# Patient Record
Sex: Female | Born: 1960 | Race: White | Hispanic: No | Marital: Married | Smoking: Never smoker
Health system: Southern US, Community
[De-identification: ages and names within clinical notes are randomized; demographics above are authoritative.]

## PROBLEM LIST (undated history)

## (undated) DIAGNOSIS — F419 Anxiety disorder, unspecified: Secondary | ICD-10-CM

## (undated) DIAGNOSIS — J45909 Unspecified asthma, uncomplicated: Secondary | ICD-10-CM

## (undated) DIAGNOSIS — F32A Depression, unspecified: Secondary | ICD-10-CM

## (undated) DIAGNOSIS — M069 Rheumatoid arthritis, unspecified: Secondary | ICD-10-CM

## (undated) DIAGNOSIS — I499 Cardiac arrhythmia, unspecified: Secondary | ICD-10-CM

## (undated) DIAGNOSIS — I10 Essential (primary) hypertension: Secondary | ICD-10-CM

## (undated) HISTORY — PX: TOTAL HIP ARTHROPLASTY: SHX124

---

## 2019-11-25 ENCOUNTER — Other Ambulatory Visit: Payer: Self-pay

## 2019-11-25 ENCOUNTER — Emergency Department (HOSPITAL_BASED_OUTPATIENT_CLINIC_OR_DEPARTMENT_OTHER): Payer: BC Managed Care – PPO

## 2019-11-25 ENCOUNTER — Encounter (HOSPITAL_BASED_OUTPATIENT_CLINIC_OR_DEPARTMENT_OTHER): Payer: Self-pay | Admitting: *Deleted

## 2019-11-25 ENCOUNTER — Inpatient Hospital Stay (HOSPITAL_BASED_OUTPATIENT_CLINIC_OR_DEPARTMENT_OTHER)
Admission: EM | Admit: 2019-11-25 | Discharge: 2019-12-02 | DRG: 202 | Disposition: A | Discharge status: Home / Self Care | Payer: BC Managed Care – PPO | Attending: Family Medicine | Admitting: Family Medicine

## 2019-11-25 DIAGNOSIS — Z882 Allergy status to sulfonamides status: Secondary | ICD-10-CM | POA: Diagnosis not present

## 2019-11-25 DIAGNOSIS — Z96641 Presence of right artificial hip joint: Secondary | ICD-10-CM | POA: Diagnosis present

## 2019-11-25 DIAGNOSIS — J45901 Unspecified asthma with (acute) exacerbation: Secondary | ICD-10-CM | POA: Diagnosis present

## 2019-11-25 DIAGNOSIS — E872 Acidosis, unspecified: Secondary | ICD-10-CM

## 2019-11-25 DIAGNOSIS — F329 Major depressive disorder, single episode, unspecified: Secondary | ICD-10-CM | POA: Diagnosis present

## 2019-11-25 DIAGNOSIS — Z881 Allergy status to other antibiotic agents status: Secondary | ICD-10-CM | POA: Diagnosis not present

## 2019-11-25 DIAGNOSIS — F419 Anxiety disorder, unspecified: Secondary | ICD-10-CM | POA: Diagnosis present

## 2019-11-25 DIAGNOSIS — T451X5A Adverse effect of antineoplastic and immunosuppressive drugs, initial encounter: Secondary | ICD-10-CM | POA: Diagnosis present

## 2019-11-25 DIAGNOSIS — E876 Hypokalemia: Secondary | ICD-10-CM | POA: Diagnosis not present

## 2019-11-25 DIAGNOSIS — M069 Rheumatoid arthritis, unspecified: Secondary | ICD-10-CM | POA: Diagnosis present

## 2019-11-25 DIAGNOSIS — D721 Eosinophilia, unspecified: Secondary | ICD-10-CM | POA: Diagnosis present

## 2019-11-25 DIAGNOSIS — Z6835 Body mass index (BMI) 35.0-35.9, adult: Secondary | ICD-10-CM | POA: Diagnosis not present

## 2019-11-25 DIAGNOSIS — R911 Solitary pulmonary nodule: Secondary | ICD-10-CM | POA: Diagnosis present

## 2019-11-25 DIAGNOSIS — J302 Other seasonal allergic rhinitis: Secondary | ICD-10-CM | POA: Diagnosis present

## 2019-11-25 DIAGNOSIS — J189 Pneumonia, unspecified organism: Secondary | ICD-10-CM | POA: Diagnosis not present

## 2019-11-25 DIAGNOSIS — Z880 Allergy status to penicillin: Secondary | ICD-10-CM

## 2019-11-25 DIAGNOSIS — R0902 Hypoxemia: Secondary | ICD-10-CM

## 2019-11-25 DIAGNOSIS — J9601 Acute respiratory failure with hypoxia: Secondary | ICD-10-CM | POA: Diagnosis present

## 2019-11-25 DIAGNOSIS — Z79899 Other long term (current) drug therapy: Secondary | ICD-10-CM | POA: Diagnosis not present

## 2019-11-25 DIAGNOSIS — Z20822 Contact with and (suspected) exposure to covid-19: Secondary | ICD-10-CM | POA: Diagnosis present

## 2019-11-25 DIAGNOSIS — I10 Essential (primary) hypertension: Secondary | ICD-10-CM | POA: Diagnosis present

## 2019-11-25 HISTORY — DX: Anxiety disorder, unspecified: F41.9

## 2019-11-25 HISTORY — DX: Cardiac arrhythmia, unspecified: I49.9

## 2019-11-25 HISTORY — DX: Essential (primary) hypertension: I10

## 2019-11-25 HISTORY — DX: Depression, unspecified: F32.A

## 2019-11-25 HISTORY — DX: Rheumatoid arthritis, unspecified: M06.9

## 2019-11-25 HISTORY — DX: Unspecified asthma, uncomplicated: J45.909

## 2019-11-25 LAB — CBC WITH DIFFERENTIAL/PLATELET
Abs Immature Granulocytes: 0.03 10*3/uL (ref 0.00–0.07)
Basophils Absolute: 0 10*3/uL (ref 0.0–0.1)
Basophils Relative: 0 %
Eosinophils Absolute: 0 10*3/uL (ref 0.0–0.5)
Eosinophils Relative: 0 %
HCT: 36.8 % (ref 36.0–46.0)
Hemoglobin: 12.4 g/dL (ref 12.0–15.0)
Immature Granulocytes: 1 %
Lymphocytes Relative: 9 %
Lymphs Abs: 0.5 10*3/uL — ABNORMAL LOW (ref 0.7–4.0)
MCH: 35.2 pg — ABNORMAL HIGH (ref 26.0–34.0)
MCHC: 33.7 g/dL (ref 30.0–36.0)
MCV: 104.5 fL — ABNORMAL HIGH (ref 80.0–100.0)
Monocytes Absolute: 0.6 10*3/uL (ref 0.1–1.0)
Monocytes Relative: 11 %
Neutro Abs: 4.6 10*3/uL (ref 1.7–7.7)
Neutrophils Relative %: 79 %
Platelets: 248 10*3/uL (ref 150–400)
RBC: 3.52 MIL/uL — ABNORMAL LOW (ref 3.87–5.11)
RDW: 13.1 % (ref 11.5–15.5)
WBC: 5.8 10*3/uL (ref 4.0–10.5)
nRBC: 0 % (ref 0.0–0.2)

## 2019-11-25 LAB — BASIC METABOLIC PANEL
Anion gap: 12 (ref 5–15)
BUN: 20 mg/dL (ref 6–20)
CO2: 19 mmol/L — ABNORMAL LOW (ref 22–32)
Calcium: 8.8 mg/dL — ABNORMAL LOW (ref 8.9–10.3)
Chloride: 105 mmol/L (ref 98–111)
Creatinine, Ser: 0.73 mg/dL (ref 0.44–1.00)
GFR calc Af Amer: >60 mL/min (ref >60)
GFR calc non Af Amer: >60 mL/min (ref >60)
Glucose, Bld: 227 mg/dL — ABNORMAL HIGH (ref 70–99)
Potassium: 3.5 mmol/L (ref 3.5–5.1)
Sodium: 136 mmol/L (ref 135–145)

## 2019-11-25 LAB — SARS CORONAVIRUS 2 BY RT PCR (HOSPITAL ORDER, PERFORMED IN ~~LOC~~ HOSPITAL LAB): SARS Coronavirus 2: NEGATIVE

## 2019-11-25 MED ORDER — PREDNISONE 20 MG PO TABS
40.0000 mg | ORAL_TABLET | Freq: Every day | ORAL | Status: DC
  Administered 2019-11-26 – 2019-11-30 (×5): 40 mg via ORAL
  Filled 2019-11-25 (×5): qty 2

## 2019-11-25 MED ORDER — SODIUM CHLORIDE 0.9 % IV SOLN
500.0000 mg | Freq: Once | INTRAVENOUS | Status: AC
  Administered 2019-11-25: 500 mg via INTRAVENOUS
  Filled 2019-11-25: qty 500

## 2019-11-25 MED ORDER — ALBUTEROL (5 MG/ML) CONTINUOUS INHALATION SOLN
10.0000 mg/h | INHALATION_SOLUTION | Freq: Once | RESPIRATORY_TRACT | Status: AC
  Administered 2019-11-25: 10 mg/h via RESPIRATORY_TRACT
  Filled 2019-11-25: qty 20

## 2019-11-25 MED ORDER — BUDESONIDE 0.25 MG/2ML IN SUSP
0.2500 mg | Freq: Two times a day (BID) | RESPIRATORY_TRACT | Status: DC
  Administered 2019-11-26 – 2019-12-02 (×12): 0.25 mg via RESPIRATORY_TRACT
  Filled 2019-11-25 (×12): qty 2

## 2019-11-25 MED ORDER — SODIUM CHLORIDE 0.9 % IV SOLN: INTRAVENOUS | Status: AC

## 2019-11-25 MED ORDER — SODIUM CHLORIDE 0.9 % IV SOLN
500.0000 mg | INTRAVENOUS | Status: DC
  Administered 2019-11-26 – 2019-11-28 (×2): 500 mg via INTRAVENOUS
  Filled 2019-11-25 (×2): qty 500

## 2019-11-25 MED ORDER — IPRATROPIUM-ALBUTEROL 0.5-2.5 (3) MG/3ML IN SOLN
3.0000 mL | Freq: Four times a day (QID) | RESPIRATORY_TRACT | Status: DC
  Administered 2019-11-26 (×2): 3 mL via RESPIRATORY_TRACT
  Filled 2019-11-25 (×2): qty 3

## 2019-11-25 MED ORDER — ALBUTEROL (5 MG/ML) CONTINUOUS INHALATION SOLN: 5.0000 mg/h | INHALATION_SOLUTION | Freq: Once | RESPIRATORY_TRACT | Status: DC

## 2019-11-25 MED ORDER — ALBUTEROL SULFATE HFA 108 (90 BASE) MCG/ACT IN AERS
4.0000 | INHALATION_SPRAY | Freq: Once | RESPIRATORY_TRACT | Status: AC
  Administered 2019-11-25: 4 via RESPIRATORY_TRACT
  Filled 2019-11-25: qty 6.7

## 2019-11-25 MED ORDER — ALBUTEROL SULFATE (2.5 MG/3ML) 0.083% IN NEBU: 2.5000 mg | INHALATION_SOLUTION | RESPIRATORY_TRACT | Status: DC | PRN

## 2019-11-25 MED ORDER — ENOXAPARIN SODIUM 40 MG/0.4ML ~~LOC~~ SOLN
40.0000 mg | SUBCUTANEOUS | Status: DC
  Administered 2019-11-26 – 2019-12-01 (×7): 40 mg via SUBCUTANEOUS
  Filled 2019-11-25 (×7): qty 0.4

## 2019-11-25 MED ORDER — ALBUTEROL SULFATE HFA 108 (90 BASE) MCG/ACT IN AERS
INHALATION_SPRAY | RESPIRATORY_TRACT | Status: AC
  Filled 2019-11-25: qty 6.7

## 2019-11-25 MED ORDER — SODIUM CHLORIDE 0.9 % IV SOLN
1.0000 g | INTRAVENOUS | Status: DC
  Administered 2019-11-26 – 2019-11-28 (×3): 1 g via INTRAVENOUS
  Filled 2019-11-25 (×3): qty 1

## 2019-11-25 MED ORDER — SODIUM CHLORIDE 0.9 % IV SOLN
1.0000 g | Freq: Once | INTRAVENOUS | Status: AC
  Administered 2019-11-25: 1 g via INTRAVENOUS
  Filled 2019-11-25: qty 10

## 2019-11-25 MED ORDER — IOHEXOL 350 MG/ML SOLN
100.0000 mL | Freq: Once | INTRAVENOUS | Status: AC | PRN
  Administered 2019-11-25: 73 mL via INTRAVENOUS

## 2019-11-25 MED ORDER — METHYLPREDNISOLONE SODIUM SUCC 125 MG IJ SOLR
125.0000 mg | Freq: Once | INTRAMUSCULAR | Status: AC
  Administered 2019-11-25: 125 mg via INTRAVENOUS
  Filled 2019-11-25: qty 2

## 2019-11-25 MED ORDER — SODIUM CHLORIDE 0.9 % IV SOLN
INTRAVENOUS | Status: DC | PRN
  Administered 2019-11-25 – 2019-11-26 (×2): 1000 mL via INTRAVENOUS

## 2019-11-25 NOTE — ED Notes (Signed)
ED Provider at bedside. 

## 2019-11-25 NOTE — ED Notes (Signed)
Arrived with IV in left hand, dsg wet and catheter noted to be partially out of hand, immediately dc'd and dsg applied

## 2019-11-25 NOTE — ED Triage Notes (Addendum)
Pt so hx of asthma with shob. O2 sats 82% on arrival. Pt endorses neb treatments pta

## 2019-11-25 NOTE — H&P (Signed)
History and Physical    Bridgitte Felicetti PHX:[id number] DOB: 02/07/1961 DOA: 11/25/2019  PCP: Jettie Booze, NP Patient coming from: [facility]  Chief Complaint: Shortness of breath  HPI: Dana Reyes is a 59 y.o. female with medical history significant of asthma, hypertension, rheumatoid arthritis presented to the ED with complaints of shortness of breath.  Patient reports recently being treated with a course of a steroid for an asthma exacerbation.  After she finished the course of steroid, she started having shortness of breath again and has been coughing a lot.  Denies fevers.  States she has been using her home asthma medications including Advair, Singulair, and albuterol inhaler but they are not helping.  She has no other complaints.  Denies nausea, vomiting, abdominal pain, or diarrhea.  ED Course: Oxygen saturation 84% on room air on arrival, wheezing, and was in respiratory distress.  Tachycardic with heart rate in the 120s.  She was placed on 4 L supplemental oxygen with improvement of oxygen saturation to 95%.  Tachycardia improved.  Patient was given IV Solu-Medrol 125 mg, albuterol inhaler and continuous neb treatment.  WBC 5.8, hemoglobin 12.4, hematocrit 36.8, platelet 248k.  Sodium 136, potassium 3.5, chloride 105, bicarb 19, BUN 20, creatinine 0.7, glucose 227, anion gap 12.  SARS-CoV-2 PCR test negative.  Chest x-ray showing hazy peribronchial airspace opacities in the left lower thorax suspicious for bronchitis or early atypical/viral pneumonia.  CT angiogram chest negative for PE.  Showing patchy and confluent interstitial opacities in both lungs, most pronounced in the periphery of the lungs.  Findings suspicious for COVID-19 pneumonitis versus hypersensitivity pneumonitis versus influenza pneumonia and organizing pneumonia versus drug toxicity versus connective tissue disease.  She was given ceftriaxone and azithromycin.   Review of Systems:  All systems reviewed and apart from  history of presenting illness, are negative.  Past Medical History:  Diagnosis Date  . Anxiety   . Asthma   . Cardiac dysrhythmia   . Depression   . Hypertension   . RA (rheumatoid arthritis) ([place])     Past Surgical History:  Procedure Laterality Date  . TOTAL HIP ARTHROPLASTY Right      reports that she has never smoked. She has never used smokeless tobacco. She reports current alcohol use. She reports that she does not use drugs.  Allergies  Allergen Reactions  . Penicillins   . Sulphamethoxydiazine     Family History  Problem Relation Age of Onset  . Breast cancer Mother   . CAD Father     Prior to Admission medications   Not on File    Physical Exam: Vitals:   11/25/19 1930 11/25/19 2000 11/25/19 2013 11/25/19 2059  BP: 124/77 123/75  140/73  Pulse: (!) 110 (!) 110  (!) 106  Resp: (!) '24 19  20  ' Temp:   98.4 F (36.9 C) 98.1 F (36.7 C)  TempSrc:   Oral Oral  SpO2: 96% 93%  96%  Weight:      Height:        Physical Exam Constitutional:      General: She is not in acute distress. HENT:     Head: Normocephalic and atraumatic.  Eyes:     Extraocular Movements: Extraocular movements intact.     Conjunctiva/sclera: Conjunctivae normal.  Cardiovascular:     Rate and Rhythm: Normal rate and regular rhythm.     Pulses: Normal pulses.  Pulmonary:     Effort: Pulmonary effort is normal.     Breath sounds:  Rales present. No wheezing.     Comments: Satting well on 4 L supplemental oxygen No increased work of breathing Abdominal:     General: Bowel sounds are normal. There is no distension.     Palpations: Abdomen is soft.     Tenderness: There is no abdominal tenderness.  Musculoskeletal:        General: No swelling or tenderness.     Cervical back: Normal range of motion and neck supple.  Skin:    General: Skin is warm and dry.  Neurological:     Mental Status: She is alert and oriented to person, place, and time.     Labs on Admission: I have  personally reviewed following labs and imaging studies  CBC: Recent Labs  Lab 11/25/19 1414  WBC 5.8  NEUTROABS 4.6  HGB 12.4  HCT 36.8  MCV 104.5*  PLT 357   Basic Metabolic Panel: Recent Labs  Lab 11/25/19 1414  NA 136  K 3.5  CL 105  CO2 19*  GLUCOSE 227*  BUN 20  CREATININE 0.73  CALCIUM 8.8*   GFR: Estimated Creatinine Clearance: 80.9 mL/min (by C-G formula based on SCr of 0.73 mg/dL). Liver Function Tests: No results for input(s): AST, ALT, ALKPHOS, BILITOT, PROT, ALBUMIN in the last 168 hours. No results for input(s): LIPASE, AMYLASE in the last 168 hours. No results for input(s): AMMONIA in the last 168 hours. Coagulation Profile: No results for input(s): INR, PROTIME in the last 168 hours. Cardiac Enzymes: No results for input(s): CKTOTAL, CKMB, CKMBINDEX, TROPONINI in the last 168 hours. BNP (last 3 results) No results for input(s): PROBNP in the last 8760 hours. HbA1C: No results for input(s): HGBA1C in the last 72 hours. CBG: No results for input(s): GLUCAP in the last 168 hours. Lipid Profile: No results for input(s): CHOL, HDL, LDLCALC, TRIG, CHOLHDL, LDLDIRECT in the last 72 hours. Thyroid Function Tests: No results for input(s): TSH, T4TOTAL, FREET4, T3FREE, THYROIDAB in the last 72 hours. Anemia Panel: No results for input(s): VITAMINB12, FOLATE, FERRITIN, TIBC, IRON, RETICCTPCT in the last 72 hours. Urine analysis: No results found for: COLORURINE, APPEARANCEUR, LABSPEC, [place], GLUCOSEU, HGBUR, BILIRUBINUR, KETONESUR, PROTEINUR, UROBILINOGEN, NITRITE, LEUKOCYTESUR  Radiological Exams on Admission: CT Angio Chest PE W/Cm &/Or Wo Cm  Result Date: 11/25/2019 CLINICAL DATA:  Dyspnea Cough and chest congestion. Negative COVID-19 test yesterday. EXAM: CT ANGIOGRAPHY CHEST WITH CONTRAST TECHNIQUE: Multidetector CT imaging of the chest was performed using the standard protocol during bolus administration of intravenous contrast. Multiplanar CT image  reconstructions and MIPs were obtained to evaluate the vascular anatomy. CONTRAST:  29m OMNIPAQUE IOHEXOL 350 MG/ML SOLN COMPARISON:  Portable chest obtained earlier today. FINDINGS: Cardiovascular: Satisfactory opacification of the pulmonary arteries to the segmental level. No evidence of pulmonary embolism. Normal heart size. No pericardial effusion. Mediastinum/Nodes: Mildly enlarged right hilar lymph node with a short axis diameter 12 mm on image number 47 series 6. Normal sized subcarinal and AP window lymph nodes. No enlarged axillary or left hilar lymph nodes. Lungs/Pleura: Patchy and confluent interstitial opacities in both lungs, most pronounced in the periphery of the lungs. 3 mm subpleural nodule in superior segment of the left lower lobe on image number 45 series 7. Minimal bilateral dependent atelectasis.  No pleural fluid. Upper Abdomen: Diffuse low density of the liver relative to the spleen. Musculoskeletal: Mild thoracic spine degenerative changes. Interbody and hardware fusion in the lower cervical spine. Review of the MIP images confirms the above findings. IMPRESSION: 1. No  pulmonary emboli. 2. Patchy and confluent interstitial opacities in both lungs, most pronounced in the periphery of the lungs. This can be seen with COVID-19 pneumonitis and hypersensitivity pneumonitis. Other processes such as influenza pneumonia and organizing pneumonia, as can be seen with drug toxicity and connective tissue disease, can cause a similar imaging pattern. 3. 3 mm subpleural nodule in the superior segment of the left lower lobe. No follow-up needed if patient is low-risk. Non-contrast chest CT can be considered in 12 months if patient is high-risk. This recommendation follows the consensus statement: Guidelines for Management of Incidental Pulmonary Nodules Detected on CT Images: From the Fleischner Society 2017; Radiology 2017; 284:228-243. 4. Mildly enlarged right hilar lymph node, most likely reactive. 5.  Diffuse hepatic steatosis. Electronically Signed   By: Claudie Revering M.D.   On: 11/25/2019 16:07   DG Chest Portable 1 View  Result Date: 11/25/2019 CLINICAL DATA:  Shortness of breath. EXAM: PORTABLE CHEST 1 VIEW COMPARISON:  None. FINDINGS: Cardiomediastinal silhouette is normal. Mediastinal contours appear intact. Hazy peribronchial airspace opacities in the left lower thorax. Osseous structures are without acute abnormality. Soft tissues are grossly normal. IMPRESSION: Hazy peribronchial airspace opacities in the left lower thorax may represent bronchitis or early atypical/viral pneumonia. Electronically Signed   By: Fidela Salisbury M.D.   On: 11/25/2019 14:42    EKG: Independently reviewed.  Sinus tachycardia, no prior tracing for comparison.  Assessment/Plan Principal Problem:   Asthma exacerbation Active Problems:   Pneumonia   Metabolic acidosis   Hypertension   Rheumatoid arthritis ([place])   Acute hypoxemic respiratory failure secondary to acute asthma exacerbation and suspected pneumonia: Oxygen saturation 84% on room air, wheezing, and was in respiratory distress at the time of ED arrival.  She was treated with IV Solu-Medrol 125 mg, albuterol inhaler and continuous neb treatment.  Not wheezing or tachypneic at present.  Satting well on 2 L supplemental oxygen. CT angiogram chest negative for PE.  Showing patchy and confluent interstitial opacities in both lungs, most pronounced in the periphery of the lungs.  Findings suspicious for COVID-19 pneumonitis versus hypersensitivity pneumonitis versus influenza pneumonia and organizing pneumonia versus drug toxicity versus connective tissue disease.  Afebrile and no leukocytosis on labs.  Covid infection less likely as SARS-CoV-2 PCR test negative and she has already been vaccinated. -Prednisone 40 mg daily starting in the morning.  DuoNebs every 6 hours, Pulmicort nebulizer twice daily, and albuterol nebulizer as needed.  Continue  ceftriaxone and azithromycin.  Check procalcitonin level.  Check ESR and CRP.  Order RVP.  Order sputum and blood cultures.  Strep pneumo and Legionella urinary antigen. Continuous pulse ox, supplemental oxygen as needed.  Normal anion gap metabolic acidosis: Bicarb 19, anion gap 12.  Not on a diuretic and not endorsing diarrhea. -IV fluid hydration and repeat BMP in a.m.  Pulmonary nodule: CT showing a 3 mm subpleural nodule in the superior segment of the left lower lobe. -Ensure PCP follow-up for noncontrast chest CT within 12 months  Hypertension: Currently normotensive. -Pharmacy med rec pending.  Rheumatoid arthritis -Resume home meds after pharmacy med rec is done  DVT prophylaxis: Lovenox Code Status: Full code Family Communication: No family available at this time. Disposition Plan: Status is: Inpatient  Remains inpatient appropriate because:Ongoing diagnostic testing needed not appropriate for outpatient work up, IV treatments appropriate due to intensity of illness or inability to take PO and Inpatient level of care appropriate due to severity of illness   Dispo: The patient is  from: Home              Anticipated d/c is to: Home              Anticipated d/c date is: 2 days              Patient currently is not medically stable to d/c.  The medical decision making on this patient was of high complexity and the patient is at high risk for clinical deterioration, therefore this is a level 3 visit.  Shela Leff MD [facility]  If 7PM-7AM, please contact night-coverage [email or web address]  11/25/2019, 11:06 PM

## 2019-11-25 NOTE — ED Notes (Signed)
CAT finished and RT went to attempt ambulation. SAT dropped to 89% before we could even attempt out walk. Placed back on [facility]. MD made aware.

## 2019-11-25 NOTE — ED Notes (Signed)
MDI 4 puffs of Albuterol inhaler administered, SMI utilized, pt very cooperative and good inspiration noted.

## 2019-11-25 NOTE — ED Provider Notes (Signed)
[facility] EMERGENCY DEPARTMENT Provider Note   CSN: [id number] Arrival date & time: 11/25/19  1328     History Chief Complaint  Patient presents with  . Shortness of Breath    Dana Reyes is a 59 y.o. female past medical history significant for asthma, rheumatoid arthritis on methotrexate and embrel.  Received COVID vaccinations.  HPI Patient presents to emergency department today with chief complaint of progressively worsening shortness of breath x2 days.  Patient tried steroids, nebulizer treatment and IV magnesium last night without symptom improvement. She was found to by hypoxic at rest with oxygen saturation of 87% on room air, it was worse with ambulation.  Also endorsing nonproductive cough.  Patient finished a prednisone Dosepak x1 week ago for an asthma exacerbation. She denies any recent illness, fever, chills, hemoptysis, chest pain, low extremity edema, abdominal pain, nausea, emesis, urinary symptoms, diarrhea, rash.  Patient has been hospitalized with asthma exacerbation 1 time in the past, never intubated secondary to exacerbations.  No sick contacts or known Covid exposures.  No tobacco use.   Past Medical History:  Diagnosis Date  . Anxiety   . Asthma   . Cardiac dysrhythmia   . Depression   . Hypertension   . RA (rheumatoid arthritis) [facility])     Patient Active Problem List   Diagnosis Date Noted  . Asthma exacerbation 11/25/2019       OB History   No obstetric history on file.     No family history on file.  Social History   Tobacco Use  . Smoking status: Not on file  Substance Use Topics  . Alcohol use: Not on file  . Drug use: Not on file    Home Medications Prior to Admission medications   Not on File    Allergies    Penicillins and Sulphamethoxydiazine  Review of Systems   Review of Systems All other systems are reviewed and are negative for acute change except as noted in the HPI.  Physical Exam Updated Vital  Signs BP 128/72 (BP Location: Right Arm)   Pulse (!) 104   Temp 97.8 F (36.6 C) (Oral)   Resp 18   Ht  (1.6 m)   Wt 90.7 kg   SpO2 99%   BMI 35.43 kg/m   Physical Exam Vitals and nursing note reviewed.  Constitutional:      General: She is not in acute distress.    Appearance: She is not ill-appearing.  HENT:     Head: Normocephalic and atraumatic.     Right Ear: Tympanic membrane and external ear normal.     Left Ear: Tympanic membrane and external ear normal.     Nose: Nose normal.     Mouth/Throat:     Mouth: Mucous membranes are moist.     Pharynx: Oropharynx is clear.  Eyes:     General: No scleral icterus.       Right eye: No discharge.        Left eye: No discharge.     Extraocular Movements: Extraocular movements intact.     Conjunctiva/sclera: Conjunctivae normal.     Pupils: Pupils are equal, round, and reactive to light.  Neck:     Vascular: No JVD.  Cardiovascular:     Rate and Rhythm: Normal rate and regular rhythm.     Pulses: Normal pulses.          Radial pulses are 2+ on the right side and 2+ on the left side.  Heart sounds: Normal heart sounds.  Pulmonary:     Comments: Expiratory wheeze heard. Symmetric chest rise.  Oxygen saturation on room air is 84%.  Patient in respiratory distress.  Only able to give 1 word answers. Accessory muscle use. Abdominal:     Comments: Abdomen is soft, non-distended, and non-tender in all quadrants. No rigidity, no guarding. No peritoneal signs.  Musculoskeletal:        General: Normal range of motion.     Cervical back: Normal range of motion.  Skin:    General: Skin is warm and dry.     Capillary Refill: Capillary refill takes less than 2 seconds.  Neurological:     Mental Status: She is oriented to person, place, and time.     GCS: GCS eye subscore is 4. GCS verbal subscore is 5. GCS motor subscore is 6.     Comments: Fluent speech, no facial droop.  Psychiatric:        Behavior: Behavior normal.      ED Results / Procedures / Treatments   Labs (all labs ordered are listed, but only abnormal results are displayed) Labs Reviewed  CBC WITH DIFFERENTIAL/PLATELET - Abnormal; Notable for the following components:      Result Value   RBC 3.52 (*)    MCV 104.5 (*)    MCH 35.2 (*)    Lymphs Abs 0.5 (*)    All other components within normal limits  BASIC METABOLIC PANEL - Abnormal; Notable for the following components:   CO2 19 (*)    Glucose, Bld 227 (*)    Calcium 8.8 (*)    All other components within normal limits  SARS CORONAVIRUS 2 BY RT PCR [facility] ORDER, PERFORMED IN [facility])    EKG EKG Interpretation  Date/Time:  Sunday November 25 2019 14:44:54 EDT Ventricular Rate:  105 PR Interval:    QRS Duration: 86 QT Interval:  364 QTC Calculation: 482 R Axis:   90 Text Interpretation: Sinus tachycardia Borderline right axis deviation Low voltage, precordial leads Borderline T abnormalities, diffuse leads No previous ECGs available Confirmed by Vanetta Mulders [id number]) on 11/25/2019 4:46:11 PM   Radiology CT Angio Chest PE W/Cm &/Or Wo Cm  Result Date: 11/25/2019 CLINICAL DATA:  Dyspnea Cough and chest congestion. Negative COVID-19 test yesterday. EXAM: CT ANGIOGRAPHY CHEST WITH CONTRAST TECHNIQUE: Multidetector CT imaging of the chest was performed using the standard protocol during bolus administration of intravenous contrast. Multiplanar CT image reconstructions and MIPs were obtained to evaluate the vascular anatomy. CONTRAST:  73mL OMNIPAQUE IOHEXOL 350 MG/ML SOLN COMPARISON:  Portable chest obtained earlier today. FINDINGS: Cardiovascular: Satisfactory opacification of the pulmonary arteries to the segmental level. No evidence of pulmonary embolism. Normal heart size. No pericardial effusion. Mediastinum/Nodes: Mildly enlarged right hilar lymph node with a short axis diameter 12 mm on image number 47 series 6. Normal sized subcarinal and AP window lymph  nodes. No enlarged axillary or left hilar lymph nodes. Lungs/Pleura: Patchy and confluent interstitial opacities in both lungs, most pronounced in the periphery of the lungs. 3 mm subpleural nodule in superior segment of the left lower lobe on image number 45 series 7. Minimal bilateral dependent atelectasis.  No pleural fluid. Upper Abdomen: Diffuse low density of the liver relative to the spleen. Musculoskeletal: Mild thoracic spine degenerative changes. Interbody and hardware fusion in the lower cervical spine. Review of the MIP images confirms the above findings. IMPRESSION: 1. No pulmonary emboli. 2. Patchy and confluent interstitial opacities  in both lungs, most pronounced in the periphery of the lungs. This can be seen with COVID-19 pneumonitis and hypersensitivity pneumonitis. Other processes such as influenza pneumonia and organizing pneumonia, as can be seen with drug toxicity and connective tissue disease, can cause a similar imaging pattern. 3. 3 mm subpleural nodule in the superior segment of the left lower lobe. No follow-up needed if patient is low-risk. Non-contrast chest CT can be considered in 12 months if patient is high-risk. This recommendation follows the consensus statement: Guidelines for Management of Incidental Pulmonary Nodules Detected on CT Images: From the Fleischner Society 2017; Radiology 2017; 284:228-243. 4. Mildly enlarged right hilar lymph node, most likely reactive. 5. Diffuse hepatic steatosis. Electronically Signed   By: Beckie Salts M.D.   On: 11/25/2019 16:07   DG Chest Portable 1 View  Result Date: 11/25/2019 CLINICAL DATA:  Shortness of breath. EXAM: PORTABLE CHEST 1 VIEW COMPARISON:  None. FINDINGS: Cardiomediastinal silhouette is normal. Mediastinal contours appear intact. Hazy peribronchial airspace opacities in the left lower thorax. Osseous structures are without acute abnormality. Soft tissues are grossly normal. IMPRESSION: Hazy peribronchial airspace opacities in  the left lower thorax may represent bronchitis or early atypical/viral pneumonia. Electronically Signed   By: Ted Mcalpine M.D.   On: 11/25/2019 14:42    Procedures .Critical Care Performed by: Sherene Sires, PA-C Authorized by: Sherene Sires, PA-C   Critical care provider statement:    Critical care time (minutes):  30   Critical care time was exclusive of:  Separately billable procedures and treating other patients and teaching time   Critical care was necessary to treat or prevent imminent or life-threatening deterioration of the following conditions:  Respiratory failure   Critical care was time spent personally by me on the following activities:  Development of treatment plan with patient or surrogate, evaluation of patient's response to treatment, examination of patient, obtaining history from patient or surrogate, ordering and performing treatments and interventions, ordering and review of laboratory studies, ordering and review of radiographic studies, pulse oximetry, re-evaluation of patient's condition and review of old charts   I assumed direction of critical care for this patient from another provider in my specialty: no     (including critical care time)  Medications Ordered in ED Medications  azithromycin (ZITHROMAX) 500 mg in sodium chloride 0.9 % 250 mL IVPB (has no administration in time range)  0.9 %  sodium chloride infusion (1,000 mLs Intravenous New Bag/Given 11/25/19 1718)  methylPREDNISolone sodium succinate (SOLU-MEDROL) 125 mg/2 mL injection 125 mg (125 mg Intravenous Given 11/25/19 1422)  albuterol (VENTOLIN HFA) 108 (90 Base) MCG/ACT inhaler 4-6 puff (4 puffs Inhalation Given 11/25/19 1425)  iohexol (OMNIPAQUE) 350 MG/ML injection 100 mL (73 mLs Intravenous Contrast Given 11/25/19 1542)  albuterol (PROVENTIL,VENTOLIN) solution continuous neb (10 mg/hr Nebulization Given 11/25/19 1559)  cefTRIAXone (ROCEPHIN) 1 g in sodium chloride 0.9 % 100 mL IVPB (1 g  Intravenous New Bag/Given 11/25/19 1722)    ED Course  I have reviewed the triage vital signs and the nursing notes.  Pertinent labs & imaging results that were available during my care of the patient were reviewed by me and considered in my medical decision making (see chart for details).  Vitals:   11/25/19 1600 11/25/19 1630 11/25/19 1700 11/25/19 1736  BP: 135/79 132/72 127/68 136/83  Pulse: (!) 104 (!) 115 (!) 116 (!) 117  Resp: 20 (!) 25 (!) 23 18  Temp:      TempSrc:  SpO2: 100% 100% 95% 94%  Weight:      Height:          MDM Rules/Calculators/A&P                          History provided by patient with additional history obtained from chart review.    Called to room on patient's arrival.  She is hypoxic to 84% on room air.  She is in respiratory distress.  Expiratory wheeze heard.  Chest sounds tight.  She is tachycardic to 120, uncomfortable appearing.  Placed on 4 L nasal cannula with improvement to 95%.  Tachycardia improved, heart rate <105. IV solumedrol and albuterol inhaler ordered.  Labs show no leukocytosis, hemoglobin normal.  BMP is slightly low bicarb at 19, normal anion gap.  No significant electrolyte derangement, no renal insufficiency, no anion gap.  Glucose elevated at 227, patient received steroids prior to arrival and has prednisone burst recently which is likely contributing. Covid test is negative. EKG shows sinus tachycardia. I viewed pt's chest xray and it shows possible bronchitis versus atypical infection with a opacity in left lower thorax.  Given her hypoxia CTA chest ordered, it is negative for PE. CTA does show patchy and confluent interstitial opacities in bilateral lungs.  Concerning for hypersensitivity pneumonitis, influenza pneumonia, developing pneumonia. Will start antibiotics to cover for possible pneumonia.  On reassessment wheezing has resolved.  She is still tachycardic, achypneic and hypoxic on room air. Requiring 4L nasal  cannula. This case was discussed with Dr. Dalene Seltzer who has seen the patient and agrees with plan to admit.  Spoke with Dr. Lucianne Muss with hospitalist service who agrees to assume care of patient and bring into the hospital for further evaluation and management.   Portions of this note were generated with Scientist, clinical (histocompatibility and immunogenetics). Dictation errors may occur despite best attempts at proofreading.   Final Clinical Impression(s) / ED Diagnoses Final diagnoses:  Hypoxia  Exacerbation of asthma, unspecified asthma severity, unspecified whether persistent    Rx / DC Orders ED Discharge Orders    None       Sherene Sires, PA-C 11/25/19 1812    Alvira Monday, MD 11/26/19 1038

## 2019-11-25 NOTE — ED Notes (Signed)
LATE NURSING NOTE ENTRY: Pt arrived into exam room 4, very tachypneic, dyspneic and monosyllabic noted. Immediately placed on cardiac monitor with cont POX monitoring, POX on RA at 84%, immediately placed on 4lpm via [place], pt remained alert and oriented x 4 at all times, remained cooperative. Upon placement of [place], POX immediately began to increase to 95% with 4lpm. HR no longer at 120's ST, not at 108/min, appears much more comfortable, able to speak in complete sentences.

## 2019-11-26 LAB — RESPIRATORY PANEL BY PCR

## 2019-11-26 LAB — BASIC METABOLIC PANEL
Anion gap: 13 (ref 5–15)
BUN: 17 mg/dL (ref 6–20)
CO2: 18 mmol/L — ABNORMAL LOW (ref 22–32)
Calcium: 8.5 mg/dL — ABNORMAL LOW (ref 8.9–10.3)
Chloride: 106 mmol/L (ref 98–111)
Creatinine, Ser: 0.63 mg/dL (ref 0.44–1.00)
GFR calc Af Amer: >60 mL/min (ref >60)
GFR calc non Af Amer: >60 mL/min (ref >60)
Glucose, Bld: 216 mg/dL — ABNORMAL HIGH (ref 70–99)
Potassium: 3.7 mmol/L (ref 3.5–5.1)
Sodium: 137 mmol/L (ref 135–145)

## 2019-11-26 LAB — C-REACTIVE PROTEIN: CRP: 16.8 mg/dL — ABNORMAL HIGH (ref <1.0)

## 2019-11-26 LAB — SEDIMENTATION RATE: Sed Rate: 64 mm/hr — ABNORMAL HIGH (ref 0–22)

## 2019-11-26 LAB — PROCALCITONIN: Procalcitonin: <0.1 ng/mL

## 2019-11-26 LAB — STREP PNEUMONIAE URINARY ANTIGEN: Strep Pneumo Urinary Antigen: NEGATIVE

## 2019-11-26 MED ORDER — MONTELUKAST SODIUM 10 MG PO TABS
10.0000 mg | ORAL_TABLET | Freq: Every day | ORAL | Status: DC
  Administered 2019-11-26 – 2019-12-01 (×6): 10 mg via ORAL
  Filled 2019-11-26 (×6): qty 1

## 2019-11-26 MED ORDER — POLYETHYLENE GLYCOL 3350 17 GM/SCOOP PO POWD
17.0000 g | Freq: Every day | ORAL | Status: DC | PRN
  Filled 2019-11-26: qty 255

## 2019-11-26 MED ORDER — POLYETHYLENE GLYCOL 3350 17 G PO PACK: 17.0000 g | PACK | Freq: Every day | ORAL | Status: DC | PRN

## 2019-11-26 MED ORDER — BUPROPION HCL ER (XL) 300 MG PO TB24
300.0000 mg | ORAL_TABLET | Freq: Every day | ORAL | Status: DC
  Administered 2019-11-26 – 2019-12-02 (×7): 300 mg via ORAL
  Filled 2019-11-26 (×7): qty 1

## 2019-11-26 MED ORDER — ACETAMINOPHEN 325 MG PO TABS: 650.0000 mg | ORAL_TABLET | Freq: Every day | ORAL | Status: DC | PRN

## 2019-11-26 MED ORDER — SERTRALINE HCL 100 MG PO TABS
100.0000 mg | ORAL_TABLET | Freq: Every day | ORAL | Status: DC
  Administered 2019-11-26 – 2019-12-02 (×7): 100 mg via ORAL
  Filled 2019-11-26 (×7): qty 1

## 2019-11-26 MED ORDER — BENZONATATE 100 MG PO CAPS
200.0000 mg | ORAL_CAPSULE | Freq: Two times a day (BID) | ORAL | Status: DC | PRN
  Administered 2019-11-26 – 2019-11-29 (×4): 200 mg via ORAL
  Filled 2019-11-26 (×4): qty 2

## 2019-11-26 MED ORDER — DIAZEPAM 5 MG PO TABS: 5.0000 mg | ORAL_TABLET | Freq: Four times a day (QID) | ORAL | Status: DC | PRN

## 2019-11-26 MED ORDER — HYDROCODONE-ACETAMINOPHEN 10-325 MG PO TABS
1.0000 | ORAL_TABLET | Freq: Three times a day (TID) | ORAL | Status: DC | PRN
  Administered 2019-11-27 – 2019-12-02 (×13): 1 via ORAL
  Filled 2019-11-26 (×13): qty 1

## 2019-11-26 MED ORDER — FOLIC ACID 1 MG PO TABS
1.0000 mg | ORAL_TABLET | Freq: Every day | ORAL | Status: DC
  Administered 2019-11-26 – 2019-12-02 (×7): 1 mg via ORAL
  Filled 2019-11-26 (×7): qty 1

## 2019-11-26 MED ORDER — TIZANIDINE HCL 4 MG PO TABS
4.0000 mg | ORAL_TABLET | Freq: Every day | ORAL | Status: DC
  Administered 2019-11-26 – 2019-12-01 (×6): 4 mg via ORAL
  Filled 2019-11-26 (×6): qty 1

## 2019-11-26 NOTE — Plan of Care (Signed)

## 2019-11-26 NOTE — Progress Notes (Signed)
PROGRESS NOTE    Dana Reyes  MWN:[id number] DOB: 1960-04-27 DOA: 11/25/2019 PCP: Jettie Booze, NP   Brief Narrative:  HPI: Dana Reyes is a 59 y.o. female with medical history significant of asthma, hypertension, rheumatoid arthritis presented to the ED with complaints of shortness of breath.  Patient reports recently being treated with a course of a steroid for an asthma exacerbation.  After she finished the course of steroid, she started having shortness of breath again and has been coughing a lot.  Denies fevers.  States she has been using her home asthma medications including Advair, Singulair, and albuterol inhaler but they are not helping.  She has no other complaints.  Denies nausea, vomiting, abdominal pain, or diarrhea.  ED Course: Oxygen saturation 84% on room air on arrival, wheezing, and was in respiratory distress.  Tachycardic with heart rate in the 120s.  She was placed on 4 L supplemental oxygen with improvement of oxygen saturation to 95%.  Tachycardia improved.  Patient was given IV Solu-Medrol 125 mg, albuterol inhaler and continuous neb treatment.  WBC 5.8, hemoglobin 12.4, hematocrit 36.8, platelet 248k.  Sodium 136, potassium 3.5, chloride 105, bicarb 19, BUN 20, creatinine 0.7, glucose 227, anion gap 12.  SARS-CoV-2 PCR test negative.  Chest x-ray showing hazy peribronchial airspace opacities in the left lower thorax suspicious for bronchitis or early atypical/viral pneumonia.  CT angiogram chest negative for PE.  Showing patchy and confluent interstitial opacities in both lungs, most pronounced in the periphery of the lungs.  Findings suspicious for COVID-19 pneumonitis versus hypersensitivity pneumonitis versus influenza pneumonia and organizing pneumonia versus drug toxicity versus connective tissue disease.  She was given ceftriaxone and azithromycin.  Assessment & Plan:   Principal Problem:   Asthma exacerbation Active Problems:   Pneumonia   Metabolic  acidosis   Hypertension   Rheumatoid arthritis ([place])   Acute hypoxic respiratory failure and sepsis secondary to acute asthma exacerbation and community-acquired pneumonia: Patient met sepsis greater based on tachycardia and tachypnea and clear source of infiltrate/pneumonia on the CT chest as well as x-ray.  Oxygen saturation 84% on room air upon arrival to ED.  Elevated ESR and CRP.  Procalcitonin unremarkable.  Strep pneumoniae urinary antigen negative.  Legionella antigen pending.  Negative respiratory panel.  She does have significant rhonchi bilaterally.  Cannot rule out bacterial pneumonia.  We will continue Rocephin and Zithromax for that and will also continue prednisone for asthma exacerbation.  As needed DuoNeb.  Normal anion gap metabolic acidosis: Likely due to dehydration.  Will start on gentle hydration.  Pulmonary nodule: CT showing a 3 mm subpleural nodule in the superior segment of the left lower lobe. -Ensure PCP follow-up for noncontrast chest CT within 12 months  Essential hypertension: Currently normotensive.  Takes lisinopril at home.  We will hold that.  Rheumatoid arthritis on methotrexate weekly.  We will hold that for now.  DVT prophylaxis: enoxaparin (LOVENOX) injection 40 mg Start: 11/25/19 2315   Code Status: Not on file  Family Communication: None present at bedside.  Plan of care discussed with patient in length and he verbalized understanding and agreed with it.  Status is: Inpatient  Remains inpatient appropriate because:Inpatient level of care appropriate due to severity of illness   Dispo: The patient is from: Home              Anticipated d/c is to: Home              Anticipated d/c date is:  1 day              Patient currently is not medically stable to d/c.        Estimated body mass index is 35.43 kg/m as calculated from the following:   Height as of this encounter: '5\' 3"'  (1.6 m).   Weight as of this encounter: 90.7 kg.       Nutritional status:               Consultants:   None  Procedures:   None  Antimicrobials:  Anti-infectives (From admission, onward)   Start     Dose/Rate Route Frequency Ordered Stop   11/26/19 1800  azithromycin (ZITHROMAX) 500 mg in sodium chloride 0.9 % 250 mL IVPB        500 mg 250 mL/hr over 60 Minutes Intravenous Every 24 hours 11/25/19 2302     11/26/19 1700  cefTRIAXone (ROCEPHIN) 1 g in sodium chloride 0.9 % 100 mL IVPB        1 g 200 mL/hr over 30 Minutes Intravenous Every 24 hours 11/25/19 2302     11/25/19 1700  cefTRIAXone (ROCEPHIN) 1 g in sodium chloride 0.9 % 100 mL IVPB        1 g 200 mL/hr over 30 Minutes Intravenous  Once 11/25/19 1656 11/25/19 1753   11/25/19 1700  azithromycin (ZITHROMAX) 500 mg in sodium chloride 0.9 % 250 mL IVPB        500 mg 250 mL/hr over 60 Minutes Intravenous  Once 11/25/19 1656 11/25/19 1930         Subjective: Patient seen and examined.  Better but still with significant shortness of breath.  She is having significant dyspnea while talking to me and has constant coughing.  Objective: Vitals:   11/26/19 0509 11/26/19 0514 11/26/19 0834 11/26/19 0928  BP: 136/71 121/75  116/78  Pulse: 93 (!) 105  92  Resp: '19 20  18  ' Temp: 97.8 F (36.6 C) 98.1 F (36.7 C)  98 F (36.7 C)  TempSrc: Oral Oral  Oral  SpO2: 99% 100% 98% 98%  Weight:      Height:        Intake/Output Summary (Last 24 hours) at 11/26/2019 1341 Last data filed at 11/25/2019 2354 Gross per 24 hour  Intake 385.32 ml  Output 750 ml  Net -364.68 ml   Filed Weights   11/25/19 1432  Weight: 90.7 kg    Examination:  General exam: Appears calm and comfortable  Respiratory system: Bilateral rhonchi, no wheezes. Respiratory effort normal. Cardiovascular system: S1 & S2 heard, RRR. No JVD, murmurs, rubs, gallops or clicks. No pedal edema. Gastrointestinal system: Abdomen is nondistended, soft and nontender. No organomegaly or masses felt.  Normal bowel sounds heard. Central nervous system: Alert and oriented. No focal neurological deficits. Extremities: Symmetric 5 x 5 power. Skin: No rashes, lesions or ulcers Psychiatry: Judgement and insight appear normal. Mood & affect appropriate.    Data Reviewed: I have personally reviewed following labs and imaging studies  CBC: Recent Labs  Lab 11/25/19 1414  WBC 5.8  NEUTROABS 4.6  HGB 12.4  HCT 36.8  MCV 104.5*  PLT 382   Basic Metabolic Panel: Recent Labs  Lab 11/25/19 1414 11/26/19 0040  NA 136 137  K 3.5 3.7  CL 105 106  CO2 19* 18*  GLUCOSE 227* 216*  BUN 20 17  CREATININE 0.73 0.63  CALCIUM 8.8* 8.5*   GFR: Estimated Creatinine Clearance: 80.9 mL/min (by  C-G formula based on SCr of 0.63 mg/dL). Liver Function Tests: No results for input(s): AST, ALT, ALKPHOS, BILITOT, PROT, ALBUMIN in the last 168 hours. No results for input(s): LIPASE, AMYLASE in the last 168 hours. No results for input(s): AMMONIA in the last 168 hours. Coagulation Profile: No results for input(s): INR, PROTIME in the last 168 hours. Cardiac Enzymes: No results for input(s): CKTOTAL, CKMB, CKMBINDEX, TROPONINI in the last 168 hours. BNP (last 3 results) No results for input(s): PROBNP in the last 8760 hours. HbA1C: No results for input(s): HGBA1C in the last 72 hours. CBG: No results for input(s): GLUCAP in the last 168 hours. Lipid Profile: No results for input(s): CHOL, HDL, LDLCALC, TRIG, CHOLHDL, LDLDIRECT in the last 72 hours. Thyroid Function Tests: No results for input(s): TSH, T4TOTAL, FREET4, T3FREE, THYROIDAB in the last 72 hours. Anemia Panel: No results for input(s): VITAMINB12, FOLATE, FERRITIN, TIBC, IRON, RETICCTPCT in the last 72 hours. Sepsis Labs: Recent Labs  Lab 11/26/19 0040  PROCALCITON <0.10    Recent Results (from the past 240 hour(s))  SARS Coronavirus 2 by RT PCR (hospital order, performed in [facility] hospital lab) Nasopharyngeal  Nasopharyngeal Swab     Status: None   Collection Time: 11/25/19  2:15 PM   Specimen: Nasopharyngeal Swab  Result Value Ref Range Status   SARS Coronavirus 2 NEGATIVE NEGATIVE Final    Comment: (NOTE) SARS-CoV-2 target nucleic acids are NOT DETECTED.  The SARS-CoV-2 RNA is generally detectable in upper and lower respiratory specimens during the acute phase of infection. The lowest concentration of SARS-CoV-2 viral copies this assay can detect is 250 copies / mL. A negative result does not preclude SARS-CoV-2 infection and should not be used as the sole basis for treatment or other patient management decisions.  A negative result may occur with improper specimen collection / handling, submission of specimen other than nasopharyngeal swab, presence of viral mutation(s) within the areas targeted by this assay, and inadequate number of viral copies (<250 copies / mL). A negative result must be combined with clinical observations, patient history, and epidemiological information.  Fact Sheet for Patients:   StrictlyIdeas.no  Fact Sheet for Healthcare Providers: BankingDealers.co.za  This test is not yet approved or  cleared by the [place] FDA and has been authorized for detection and/or diagnosis of SARS-CoV-2 by FDA under an Emergency Use Authorization (EUA).  This EUA will remain in effect (meaning this test can be used) for the duration of the COVID-19 declaration under Section 564(b)(1) of the Act, 21 U.S.C. section 360bbb-3(b)(1), unless the authorization is terminated or revoked sooner.  Performed at [facility], [address] [place], [place] [address]   Respiratory Panel by PCR     Status: None   Collection Time: 11/25/19 11:00 PM   Specimen: Nasopharyngeal Swab; Respiratory  Result Value Ref Range Status   Adenovirus NOT DETECTED NOT DETECTED Final   Coronavirus 229E NOT DETECTED NOT DETECTED Final     Comment: (NOTE) The Coronavirus on the Respiratory Panel, DOES NOT test for the novel  Coronavirus (2019 nCoV)    Coronavirus HKU1 NOT DETECTED NOT DETECTED Final   Coronavirus NL63 NOT DETECTED NOT DETECTED Final   Coronavirus OC43 NOT DETECTED NOT DETECTED Final   Metapneumovirus NOT DETECTED NOT DETECTED Final   Rhinovirus / Enterovirus NOT DETECTED NOT DETECTED Final   Influenza A NOT DETECTED NOT DETECTED Final   Influenza B NOT DETECTED NOT DETECTED Final   Parainfluenza Virus 1  NOT DETECTED NOT DETECTED Final   Parainfluenza Virus 2 NOT DETECTED NOT DETECTED Final   Parainfluenza Virus 3 NOT DETECTED NOT DETECTED Final   Parainfluenza Virus 4 NOT DETECTED NOT DETECTED Final   Respiratory Syncytial Virus NOT DETECTED NOT DETECTED Final   Bordetella pertussis NOT DETECTED NOT DETECTED Final   Chlamydophila pneumoniae NOT DETECTED NOT DETECTED Final   Mycoplasma pneumoniae NOT DETECTED NOT DETECTED Final    Comment: Performed at [facility], [place] [address] [place], [place] [address]  Culture, blood (routine x 2)     Status: None (Preliminary result)   Collection Time: 11/26/19 12:40 AM   Specimen: BLOOD RIGHT FOREARM  Result Value Ref Range Status   Specimen Description   Final    BLOOD RIGHT FOREARM Performed at [facility], [place] [address], [place], [place] [address]    Special Requests   Final    BOTTLES DRAWN AEROBIC ONLY Blood Culture adequate volume Performed at [facility] [address] [place], [place] [address]    Culture PENDING  Incomplete   Report Status PENDING  Incomplete      Radiology Studies: CT Angio Chest PE W/Cm &/Or Wo Cm  Result Date: 11/25/2019 CLINICAL DATA:  Dyspnea Cough and chest congestion. Negative COVID-19 test yesterday. EXAM: CT ANGIOGRAPHY CHEST WITH CONTRAST TECHNIQUE: Multidetector CT imaging of the chest was performed using the standard protocol during bolus administration of intravenous contrast.  Multiplanar CT image reconstructions and MIPs were obtained to evaluate the vascular anatomy. CONTRAST:  92m OMNIPAQUE IOHEXOL 350 MG/ML SOLN COMPARISON:  Portable chest obtained earlier today. FINDINGS: Cardiovascular: Satisfactory opacification of the pulmonary arteries to the segmental level. No evidence of pulmonary embolism. Normal heart size. No pericardial effusion. Mediastinum/Nodes: Mildly enlarged right hilar lymph node with a short axis diameter 12 mm on image number 47 series 6. Normal sized subcarinal and AP window lymph nodes. No enlarged axillary or left hilar lymph nodes. Lungs/Pleura: Patchy and confluent interstitial opacities in both lungs, most pronounced in the periphery of the lungs. 3 mm subpleural nodule in superior segment of the left lower lobe on image number 45 series 7. Minimal bilateral dependent atelectasis.  No pleural fluid. Upper Abdomen: Diffuse low density of the liver relative to the spleen. Musculoskeletal: Mild thoracic spine degenerative changes. Interbody and hardware fusion in the lower cervical spine. Review of the MIP images confirms the above findings. IMPRESSION: 1. No pulmonary emboli. 2. Patchy and confluent interstitial opacities in both lungs, most pronounced in the periphery of the lungs. This can be seen with COVID-19 pneumonitis and hypersensitivity pneumonitis. Other processes such as influenza pneumonia and organizing pneumonia, as can be seen with drug toxicity and connective tissue disease, can cause a similar imaging pattern. 3. 3 mm subpleural nodule in the superior segment of the left lower lobe. No follow-up needed if patient is low-risk. Non-contrast chest CT can be considered in 12 months if patient is high-risk. This recommendation follows the consensus statement: Guidelines for Management of Incidental Pulmonary Nodules Detected on CT Images: From the Fleischner Society 2017; Radiology 2017; 284:228-243. 4. Mildly enlarged right hilar lymph node, most  likely reactive. 5. Diffuse hepatic steatosis. Electronically Signed   By: SClaudie ReveringM.D.   On: 11/25/2019 16:07   DG Chest Portable 1 View  Result Date: 11/25/2019 CLINICAL DATA:  Shortness of breath. EXAM: PORTABLE CHEST 1 VIEW COMPARISON:  None. FINDINGS: Cardiomediastinal silhouette is normal. Mediastinal contours appear intact. Hazy peribronchial airspace  opacities in the left lower thorax. Osseous structures are without acute abnormality. Soft tissues are grossly normal. IMPRESSION: Hazy peribronchial airspace opacities in the left lower thorax may represent bronchitis or early atypical/viral pneumonia. Electronically Signed   By: Fidela Salisbury M.D.   On: 11/25/2019 14:42    Scheduled Meds: . budesonide (PULMICORT) nebulizer solution  0.25 mg Nebulization BID  . enoxaparin (LOVENOX) injection  40 mg Subcutaneous Q24H  . ipratropium-albuterol  3 mL Nebulization Q6H  . predniSONE  40 mg Oral Q breakfast   Continuous Infusions: . sodium chloride 1,000 mL (11/26/19 1119)  . azithromycin    . cefTRIAXone (ROCEPHIN)  IV       LOS: 1 day   Time spent: 35 minutes   Darliss Cheney, MD [facility]  11/26/2019, 1:41 PM   To contact the attending provider between 7A-7P or the covering provider during after hours 7P-7A, please log into the web site [email or web address].

## 2019-11-27 LAB — CBC WITH DIFFERENTIAL/PLATELET
Abs Immature Granulocytes: 0.06 10*3/uL (ref 0.00–0.07)
Basophils Absolute: 0 10*3/uL (ref 0.0–0.1)
Basophils Relative: 1 %
Eosinophils Absolute: 0.4 10*3/uL (ref 0.0–0.5)
Eosinophils Relative: 5 %
HCT: 34.3 % — ABNORMAL LOW (ref 36.0–46.0)
Hemoglobin: 11.1 g/dL — ABNORMAL LOW (ref 12.0–15.0)
Immature Granulocytes: 1 %
Lymphocytes Relative: 24 %
Lymphs Abs: 1.8 10*3/uL (ref 0.7–4.0)
MCH: 35.6 pg — ABNORMAL HIGH (ref 26.0–34.0)
MCHC: 32.4 g/dL (ref 30.0–36.0)
MCV: 109.9 fL — ABNORMAL HIGH (ref 80.0–100.0)
Monocytes Absolute: 1 10*3/uL (ref 0.1–1.0)
Monocytes Relative: 13 %
Neutro Abs: 4.3 10*3/uL (ref 1.7–7.7)
Neutrophils Relative %: 56 %
Platelets: 263 10*3/uL (ref 150–400)
RBC: 3.12 MIL/uL — ABNORMAL LOW (ref 3.87–5.11)
RDW: 13.6 % (ref 11.5–15.5)
WBC: 7.5 10*3/uL (ref 4.0–10.5)
nRBC: 0 % (ref 0.0–0.2)

## 2019-11-27 LAB — BASIC METABOLIC PANEL
Anion gap: 8 (ref 5–15)
BUN: 19 mg/dL (ref 6–20)
CO2: 22 mmol/L (ref 22–32)
Calcium: 8.4 mg/dL — ABNORMAL LOW (ref 8.9–10.3)
Chloride: 112 mmol/L — ABNORMAL HIGH (ref 98–111)
Creatinine, Ser: 0.61 mg/dL (ref 0.44–1.00)
GFR calc Af Amer: >60 mL/min (ref >60)
GFR calc non Af Amer: >60 mL/min (ref >60)
Glucose, Bld: 113 mg/dL — ABNORMAL HIGH (ref 70–99)
Potassium: 3.3 mmol/L — ABNORMAL LOW (ref 3.5–5.1)
Sodium: 142 mmol/L (ref 135–145)

## 2019-11-27 LAB — MAGNESIUM: Magnesium: 2.2 mg/dL (ref 1.7–2.4)

## 2019-11-27 LAB — LEGIONELLA PNEUMOPHILA SEROGP 1 UR AG: L. pneumophila Serogp 1 Ur Ag: NEGATIVE

## 2019-11-27 MED ORDER — IPRATROPIUM-ALBUTEROL 0.5-2.5 (3) MG/3ML IN SOLN
3.0000 mL | Freq: Three times a day (TID) | RESPIRATORY_TRACT | Status: DC
  Administered 2019-11-27 – 2019-12-02 (×15): 3 mL via RESPIRATORY_TRACT
  Filled 2019-11-27 (×15): qty 3

## 2019-11-27 MED ORDER — POTASSIUM CHLORIDE CRYS ER 20 MEQ PO TBCR
40.0000 meq | EXTENDED_RELEASE_TABLET | Freq: Once | ORAL | Status: AC
  Administered 2019-11-27: 40 meq via ORAL
  Filled 2019-11-27: qty 2

## 2019-11-27 NOTE — Progress Notes (Signed)
PROGRESS NOTE    Dana Reyes  BDZ:[id number] DOB: 1960-05-11 DOA: 11/25/2019 PCP: Jettie Booze, NP   Brief Narrative:  Dana Reyes is a 59 y.o. female with medical history significant of asthma, hypertension, rheumatoid arthritis presented to the ED with complaints of shortness of breath.  Patient reports recently being treated with a course of a steroid for an asthma exacerbation.  After she finished the course of steroid, she started having shortness of breath again and has been coughing a lot.  Denies fevers.  States she has been using her home asthma medications including Advair, Singulair, and albuterol inhaler but they are not helping.  No other complaint.  Upon arrival to ED, her oxygen saturation was 84% requiring 4 L of oxygen. Patient was given IV Solu-Medrol 125 mg, albuterol inhaler and continuous neb treatment.  Covid negative. Chest x-ray showing hazy peribronchial airspace opacities in the left lower thorax suspicious for bronchitis or early atypical/viral pneumonia. CT angiogram chest negative for PE.  Showing patchy and confluent interstitial opacities in both lungs, most pronounced in the periphery of the lungs.  Findings suspicious for COVID-19 pneumonitis versus hypersensitivity pneumonitis versus influenza pneumonia and organizing pneumonia versus drug toxicity versus connective tissue disease.  He was tested negative for any other viral upper respiratory infection as well as influenza.  She was started on ceftriaxone, azithromycin and p.o. prednisone.  Her wheezes improved but she still has bilateral rhonchi and is still feels short of breath.  Still on 2 L oxygen.   Assessment & Plan:   Principal Problem:   Asthma exacerbation Active Problems:   Pneumonia   Metabolic acidosis   Hypertension   Rheumatoid arthritis ([place])   Acute hypoxic respiratory failure and sepsis secondary to acute asthma exacerbation and community-acquired pneumonia: Patient met sepsis greater  based on tachycardia and tachypnea and clear source of infiltrate/pneumonia on the CT chest as well as x-ray.  Oxygen saturation 84% on room air upon arrival to ED.  Elevated ESR and CRP.  Procalcitonin unremarkable.  Strep pneumoniae urinary antigen negative.  Legionella antigen pending.  Negative respiratory panel.  Although she feels and looks better than yesterday but still has shortness of breath and is requiring 2 L of oxygen and has bilateral rhonchi.  We will continue current medication which is IV antibiotics and oral prednisone for another day and hope that she will be better tomorrow and ready for discharge.  Requested RN to wean her oxygen to room air as able to.  Normal anion gap metabolic acidosis: Likely due to dehydration.  Improved since started on hydration.  IV fluids stopped.  Pulmonary nodule: CT showing a 3 mm subpleural nodule in the superior segment of the left lower lobe. -Ensure PCP follow-up for noncontrast chest CT within 12 months  Essential hypertension: Currently normotensive.  Takes lisinopril at home.  We will hold that.  Rheumatoid arthritis on methotrexate weekly.  We will hold that for now.  DVT prophylaxis: enoxaparin (LOVENOX) injection 40 mg Start: 11/25/19 2315   Code Status: Not on file  Family Communication: None present at bedside.  Plan of care discussed with patient in length and he verbalized understanding and agreed with it.  Status is: Inpatient  Remains inpatient appropriate because:Inpatient level of care appropriate due to severity of illness   Dispo: The patient is from: Home              Anticipated d/c is to: Home  Anticipated d/c date is: 1 day              Patient currently is not medically stable to d/c.        Estimated body mass index is 35.43 kg/m as calculated from the following:   Height as of this encounter: _0  (1.6 m).   Weight as of this encounter: 90.7 kg.      Nutritional status:                Consultants:   None  Procedures:   None  Antimicrobials:  Anti-infectives (From admission, onward)   Start     Dose/Rate Route Frequency Ordered Stop   11/26/19 1800  azithromycin (ZITHROMAX) 500 mg in sodium chloride 0.9 % 250 mL IVPB        500 mg 250 mL/hr over 60 Minutes Intravenous Every 24 hours 11/25/19 2302     11/26/19 1700  cefTRIAXone (ROCEPHIN) 1 g in sodium chloride 0.9 % 100 mL IVPB        1 g 200 mL/hr over 30 Minutes Intravenous Every 24 hours 11/25/19 2302     11/25/19 1700  cefTRIAXone (ROCEPHIN) 1 g in sodium chloride 0.9 % 100 mL IVPB        1 g 200 mL/hr over 30 Minutes Intravenous  Once 11/25/19 1656 11/25/19 1753   11/25/19 1700  azithromycin (ZITHROMAX) 500 mg in sodium chloride 0.9 % 250 mL IVPB        500 mg 250 mL/hr over 60 Minutes Intravenous  Once 11/25/19 1656 11/25/19 1930         Subjective: Patient seen and examined.  Sitting in the recliner.  She is looking and feeling much better however still has shortness of breath.  Objective: Vitals:   11/26/19 2019 11/26/19 2139 11/27/19 0552 11/27/19 0729  BP:  124/73 113/64   Pulse:  (!) 105 90 95  Resp:  _1 Temp:  97.9 F (36.6 C) 98.1 F (36.7 C)   TempSrc:  Oral Oral   SpO2: 97% 98% 95% 92%  Weight:      Height:        Intake/Output Summary (Last 24 hours) at 11/27/2019 1328 Last data filed at 11/27/2019 1137 Gross per 24 hour  Intake 240 ml  Output 1050 ml  Net -810 ml   Filed Weights   11/25/19 1432  Weight: 90.7 kg    Examination:  General exam: Appears calm and comfortable  Respiratory system: Bilateral rhonchi.  No wheezes.Marland Kitchen Respiratory effort normal. Cardiovascular system: S1 & S2 heard, RRR. No JVD, murmurs, rubs, gallops or clicks. No pedal edema. Gastrointestinal system: Abdomen is nondistended, soft and nontender. No organomegaly or masses felt. Normal bowel sounds heard. Central nervous system: Alert and oriented. No focal neurological  deficits. Extremities: Symmetric 5 x 5 power. Skin: No rashes, lesions or ulcers.  Psychiatry: Judgement and insight appear normal. Mood & affect appropriate.    Data Reviewed: I have personally reviewed following labs and imaging studies  CBC: Recent Labs  Lab 11/25/19 1414 11/27/19 0543  WBC 5.8 7.5  NEUTROABS 4.6 4.3  HGB 12.4 11.1*  HCT 36.8 34.3*  MCV 104.5* 109.9*  PLT 248 470   Basic Metabolic Panel: Recent Labs  Lab 11/25/19 1414 11/26/19 0040 11/27/19 0543  NA 136 137 142  K 3.5 3.7 3.3*  CL 105 106 112*  CO2 19* 18* 22  GLUCOSE 227* 216* 113*  BUN _2 CREATININE  0.73 0.63 0.61  CALCIUM 8.8* 8.5* 8.4*  MG  --   --  2.2   GFR: Estimated Creatinine Clearance: 80.9 mL/min (by C-G formula based on SCr of 0.61 mg/dL). Liver Function Tests: No results for input(s): AST, ALT, ALKPHOS, BILITOT, PROT, ALBUMIN in the last 168 hours. No results for input(s): LIPASE, AMYLASE in the last 168 hours. No results for input(s): AMMONIA in the last 168 hours. Coagulation Profile: No results for input(s): INR, PROTIME in the last 168 hours. Cardiac Enzymes: No results for input(s): CKTOTAL, CKMB, CKMBINDEX, TROPONINI in the last 168 hours. BNP (last 3 results) No results for input(s): PROBNP in the last 8760 hours. HbA1C: No results for input(s): HGBA1C in the last 72 hours. CBG: No results for input(s): GLUCAP in the last 168 hours. Lipid Profile: No results for input(s): CHOL, HDL, LDLCALC, TRIG, CHOLHDL, LDLDIRECT in the last 72 hours. Thyroid Function Tests: No results for input(s): TSH, T4TOTAL, FREET4, T3FREE, THYROIDAB in the last 72 hours. Anemia Panel: No results for input(s): VITAMINB12, FOLATE, FERRITIN, TIBC, IRON, RETICCTPCT in the last 72 hours. Sepsis Labs: Recent Labs  Lab 11/26/19 0040  PROCALCITON <0.10    Recent Results (from the past 240 hour(s))  SARS Coronavirus 2 by RT PCR (hospital order, performed in [facility] hospital lab)  Nasopharyngeal Nasopharyngeal Swab     Status: None   Collection Time: 11/25/19  2:15 PM   Specimen: Nasopharyngeal Swab  Result Value Ref Range Status   SARS Coronavirus 2 NEGATIVE NEGATIVE Final    Comment: (NOTE) SARS-CoV-2 target nucleic acids are NOT DETECTED.  The SARS-CoV-2 RNA is generally detectable in upper and lower respiratory specimens during the acute phase of infection. The lowest concentration of SARS-CoV-2 viral copies this assay can detect is 250 copies / mL. A negative result does not preclude SARS-CoV-2 infection and should not be used as the sole basis for treatment or other patient management decisions.  A negative result may occur with improper specimen collection / handling, submission of specimen other than nasopharyngeal swab, presence of viral mutation(s) within the areas targeted by this assay, and inadequate number of viral copies (<250 copies / mL). A negative result must be combined with clinical observations, patient history, and epidemiological information.  Fact Sheet for Patients:   StrictlyIdeas.no  Fact Sheet for Healthcare Providers: BankingDealers.co.za  This test is not yet approved or  cleared by the [place] FDA and has been authorized for detection and/or diagnosis of SARS-CoV-2 by FDA under an Emergency Use Authorization (EUA).  This EUA will remain in effect (meaning this test can be used) for the duration of the COVID-19 declaration under Section 564(b)(1) of the Act, 21 U.S.C. section 360bbb-3(b)(1), unless the authorization is terminated or revoked sooner.  Performed at [facility], [address] [place], [place] [address]   Respiratory Panel by PCR     Status: None   Collection Time: 11/25/19 11:00 PM   Specimen: Nasopharyngeal Swab; Respiratory  Result Value Ref Range Status   Adenovirus NOT DETECTED NOT DETECTED Final   Coronavirus 229E NOT DETECTED NOT  DETECTED Final    Comment: (NOTE) The Coronavirus on the Respiratory Panel, DOES NOT test for the novel  Coronavirus (2019 nCoV)    Coronavirus HKU1 NOT DETECTED NOT DETECTED Final   Coronavirus NL63 NOT DETECTED NOT DETECTED Final   Coronavirus OC43 NOT DETECTED NOT DETECTED Final   Metapneumovirus NOT DETECTED NOT DETECTED Final   Rhinovirus / Enterovirus NOT DETECTED  NOT DETECTED Final   Influenza A NOT DETECTED NOT DETECTED Final   Influenza B NOT DETECTED NOT DETECTED Final   Parainfluenza Virus 1 NOT DETECTED NOT DETECTED Final   Parainfluenza Virus 2 NOT DETECTED NOT DETECTED Final   Parainfluenza Virus 3 NOT DETECTED NOT DETECTED Final   Parainfluenza Virus 4 NOT DETECTED NOT DETECTED Final   Respiratory Syncytial Virus NOT DETECTED NOT DETECTED Final   Bordetella pertussis NOT DETECTED NOT DETECTED Final   Chlamydophila pneumoniae NOT DETECTED NOT DETECTED Final   Mycoplasma pneumoniae NOT DETECTED NOT DETECTED Final    Comment: Performed at [facility], [place] [address] [place], [place] [address]  Culture, blood (routine x 2)     Status: None (Preliminary result)   Collection Time: 11/26/19 12:40 AM   Specimen: BLOOD  Result Value Ref Range Status   Specimen Description   Final    BLOOD RIGHT ANTECUBITAL Performed at [facility] [address] [place], [place] [address]    Special Requests   Final    BOTTLES DRAWN AEROBIC ONLY Blood Culture adequate volume Performed at [facility] [address], [place], [place] [address]    Culture   Final    NO GROWTH 1 DAY Performed at [facility], [place] [address] [place], [place] [address]    Report Status PENDING  Incomplete  Culture, blood (routine x 2)     Status: None (Preliminary result)   Collection Time: 11/26/19 12:40 AM   Specimen: BLOOD RIGHT FOREARM  Result Value Ref Range Status   Specimen Description   Final    BLOOD RIGHT FOREARM Performed at [facility], [place] [address] [place], [place] [address]    Special Requests   Final    BOTTLES DRAWN AEROBIC ONLY Blood Culture adequate volume Performed at [facility] [address], [place], [place] [address]    Culture   Final    NO GROWTH 1 DAY Performed at [facility], [place] [address], [place], [place] [address]    Report Status PENDING  Incomplete      Radiology Studies: CT Angio Chest PE W/Cm &/Or Wo Cm  Result Date: 11/25/2019 CLINICAL DATA:  Dyspnea Cough and chest congestion. Negative COVID-19 test yesterday. EXAM: CT ANGIOGRAPHY CHEST WITH CONTRAST TECHNIQUE: Multidetector CT imaging of the chest was performed using the standard protocol during bolus administration of intravenous contrast. Multiplanar CT image reconstructions and MIPs were obtained to evaluate the vascular anatomy. CONTRAST:  51m OMNIPAQUE IOHEXOL 350 MG/ML SOLN COMPARISON:  Portable chest obtained earlier today. FINDINGS: Cardiovascular: Satisfactory opacification of the pulmonary arteries to the segmental level. No evidence of pulmonary embolism. Normal heart size. No pericardial effusion. Mediastinum/Nodes: Mildly enlarged right hilar lymph node with a short axis diameter 12 mm on image number 47 series 6. Normal sized subcarinal and AP window lymph nodes. No enlarged axillary or left hilar lymph nodes. Lungs/Pleura: Patchy and confluent interstitial opacities in both lungs, most pronounced in the periphery of the lungs. 3 mm subpleural nodule in superior segment of the left lower lobe on image number 45 series 7. Minimal bilateral dependent atelectasis.  No pleural fluid. Upper Abdomen: Diffuse low density of the liver relative to the spleen. Musculoskeletal: Mild thoracic spine degenerative changes. Interbody and hardware fusion in the lower cervical spine. Review of the MIP images confirms the above findings. IMPRESSION: 1. No pulmonary emboli. 2. Patchy and confluent interstitial opacities  in both lungs, most pronounced in the periphery of the lungs. This can be seen with COVID-19 pneumonitis and hypersensitivity pneumonitis. Other processes such as influenza pneumonia and organizing pneumonia, as can be seen with drug toxicity and connective tissue disease, can cause a similar imaging pattern. 3. 3 mm subpleural nodule in the superior segment of the left lower lobe. No follow-up needed if patient is low-risk. Non-contrast chest CT can be considered in 12 months if patient is high-risk. This recommendation follows the consensus statement: Guidelines for Management of Incidental Pulmonary Nodules Detected on CT Images: From the Fleischner Society 2017; Radiology 2017; 284:228-243. 4. Mildly enlarged right hilar lymph node, most likely reactive. 5. Diffuse hepatic steatosis. Electronically Signed   By: Claudie Revering M.D.   On: 11/25/2019 16:07   DG Chest Portable 1 View  Result Date: 11/25/2019 CLINICAL DATA:  Shortness of breath. EXAM: PORTABLE CHEST 1 VIEW COMPARISON:  None. FINDINGS: Cardiomediastinal silhouette is normal. Mediastinal contours appear intact. Hazy peribronchial airspace opacities in the left lower thorax. Osseous structures are without acute abnormality. Soft tissues are grossly normal. IMPRESSION: Hazy peribronchial airspace opacities in the left lower thorax may represent bronchitis or early atypical/viral pneumonia. Electronically Signed   By: Fidela Salisbury M.D.   On: 11/25/2019 14:42    Scheduled Meds: . budesonide (PULMICORT) nebulizer solution  0.25 mg Nebulization BID  . buPROPion  300 mg Oral Daily  . enoxaparin (LOVENOX) injection  40 mg Subcutaneous Q24H  . folic acid  1 mg Oral Daily  . ipratropium-albuterol  3 mL Nebulization TID  . montelukast  10 mg Oral QHS  . predniSONE  40 mg Oral Q breakfast  . sertraline  100 mg Oral Daily  . tiZANidine  4 mg Oral QHS   Continuous Infusions: . sodium chloride 1,000 mL (11/26/19 1119)  . azithromycin 500 mg  (11/26/19 2100)  . cefTRIAXone (ROCEPHIN)  IV 1 g (11/26/19 1658)     LOS: 2 days   Time spent: 30 minutes   Darliss Cheney, MD [facility]  11/27/2019, 1:28 PM   To contact the attending provider between 7A-7P or the covering provider during after hours 7P-7A, please log into the web site [email or web address].

## 2019-11-28 LAB — MAGNESIUM: Magnesium: 1.8 mg/dL (ref 1.7–2.4)

## 2019-11-28 MED ORDER — POTASSIUM CHLORIDE CRYS ER 20 MEQ PO TBCR
40.0000 meq | EXTENDED_RELEASE_TABLET | Freq: Two times a day (BID) | ORAL | Status: DC
  Administered 2019-11-28 (×2): 40 meq via ORAL
  Filled 2019-11-28 (×2): qty 2

## 2019-11-28 MED ORDER — MAGNESIUM OXIDE 400 (241.3 MG) MG PO TABS
400.0000 mg | ORAL_TABLET | Freq: Two times a day (BID) | ORAL | Status: DC
  Administered 2019-11-28 – 2019-11-29 (×3): 400 mg via ORAL
  Filled 2019-11-28 (×3): qty 1

## 2019-11-28 MED ORDER — AZITHROMYCIN 250 MG PO TABS
500.0000 mg | ORAL_TABLET | Freq: Every day | ORAL | Status: DC
  Administered 2019-11-28: 500 mg via ORAL
  Filled 2019-11-28: qty 2

## 2019-11-28 NOTE — Progress Notes (Signed)
Patient sitting in chair saturating 93% on 1L via nasal cannula. Oxygen was removed for patient to ambulate to bathroom. Oxygen dropped, lowest point to 78%. Oxygen was reapplied. After approximately 3 minutes patient began to recover. Patient currently resting in chair and is sating at 94% on 1L via [place]. Patient states her activity at home is typically walking to living and to and from bathroom. Due to her chronic back pain she states she is not been ambulating a lot and feels her breathing has made it much worse to move around.

## 2019-11-28 NOTE — Progress Notes (Signed)
PHARMACIST - PHYSICIAN COMMUNICATION DR:   Mahala Menghini CONCERNING: Antibiotic IV to Oral Route Change Policy  RECOMMENDATION: This patient is receiving azithromycin by the intravenous route.  Based on criteria approved by the Pharmacy and Therapeutics Committee, the antibiotic(s) is/are being converted to the equivalent oral dose form(s).   DESCRIPTION: These criteria include:  Patient being treated for a respiratory tract infection, urinary tract infection, cellulitis or clostridium difficile associated diarrhea if on metronidazole  The patient is not neutropenic and does not exhibit a GI malabsorption state  The patient is eating (either orally or via tube) and/or has been taking other orally administered medications for a least 24 hours  The patient has a Tmax < 100.5  If you have questions about this conversion, please contact the Pharmacy Department   Cindi Carbon, PharmD 11/28/19 7:45 AM

## 2019-11-28 NOTE — Progress Notes (Signed)
PROGRESS NOTE    Dana Reyes  CWU:[id number] DOB: 1960-12-26 DOA: 11/25/2019 PCP: Jettie Booze, NP   Brief Narrative:  59 y.o.asthma diagnosed in 2002 on inhalers and nebulizers, hypertension, rheumatoid arthritis follows at [facility] currently on Enbrel in addition to methotrexate once weekly  Recent Rx asthma exacerbation steroid burst  Came back to emergency room 9/5 Denies fevers.  States she has been using her home asthma medications including Advair, Singulair, and albuterol inhaler but they are not helping  Sats found to be 84% Rx Solu-Medrol continuous inhalers etc. Covid negative  CT chest negative for PE but consistent with possible pneumonia/pneumonitis versus drug toxicity  Patient was negative for any other viral upper respiratory infection as well as influenza.    She was started on ceftriaxone, azithromycin and p.o. prednisone.  Her wheezes improved but she still has bilateral rhonchi and is still feels short of breath.  Still on 2 L oxygen.   Assessment & Plan:   Principal Problem:   Asthma exacerbation Active Problems:   Pneumonia   Metabolic acidosis   Hypertension   Rheumatoid arthritis ([place])   Acute hypoxic respiratory failure and sepsis secondary to acute asthma exacerbation and community-acquired pneumonia:  Differential diagnosis possibly methotrexate lung??  Patient met sepsis greater based on tachycardia and tachypnea and clear source of infiltrate/pneumonia on the CT chest as well as x-ray.   Oxygen saturation 84% on room air upon arrival to ED.   Elevated ESR and CRP.  Procalcitonin unremarkable.  Pneumo Legionella negative negative respiratory panel.   Continue prednisone 40 Improved but not 100% and still feeling wheezy weak and desatted to 70% off of oxygen so will need several more days to recover  Normal anion gap metabolic acidosis: Likely due to dehydration.  resolved-CO2 now 22  Mild hypokalemia Replace twice daily with K.  Dur Replace magnesium which is low at 1.8 with oral Mag-Ox and recheck in the morning  Pulmonary nodule: CT showing a 3 mm subpleural nodule in the superior segment of the left lower lobe. -Ensure PCP follow-up for noncontrast chest CT within 12 months  Essential hypertension: Currently normotensive.  Takes lisinopril at home which has been held.  Rheumatoid arthritis on methotrexate and Enbrel 50  BMI 35 Outpatient management  DVT prophylaxis: enoxaparin (LOVENOX) injection 40 mg Start: 11/25/19 2315   Code Status: Not on file  Family Communication: None present at bedside.  Plan of care discussed with patient in length and he verbalized understanding and agreed with it.  Status is: Inpatient  Remains inpatient appropriate because:Inpatient level of care appropriate due to severity of illness   Dispo: The patient is from: Home              Anticipated d/c is to: Home              Anticipated d/c date is: 2 days              Patient currently is not medically stable to d/c.   Consultants:   None  Procedures:   None  Antimicrobials:  Anti-infectives (From admission, onward)   Start     Dose/Rate Route Frequency Ordered Stop   11/28/19 2200  azithromycin (ZITHROMAX) tablet 500 mg        500 mg Oral Daily at bedtime 11/28/19 0743     11/26/19 1800  azithromycin (ZITHROMAX) 500 mg in sodium chloride 0.9 % 250 mL IVPB  Status:  Discontinued  500 mg 250 mL/hr over 60 Minutes Intravenous Every 24 hours 11/25/19 2302 11/28/19 0743   11/26/19 1700  cefTRIAXone (ROCEPHIN) 1 g in sodium chloride 0.9 % 100 mL IVPB        1 g 200 mL/hr over 30 Minutes Intravenous Every 24 hours 11/25/19 2302     11/25/19 1700  cefTRIAXone (ROCEPHIN) 1 g in sodium chloride 0.9 % 100 mL IVPB        1 g 200 mL/hr over 30 Minutes Intravenous  Once 11/25/19 1656 11/25/19 1753   11/25/19 1700  azithromycin (ZITHROMAX) 500 mg in sodium chloride 0.9 % 250 mL IVPB        500 mg 250 mL/hr over 60  Minutes Intravenous  Once 11/25/19 1656 11/25/19 1930         Subjective:  Doing fair, still wheezy Oxygen dropped to 78% with ambulation came up with oxygen at 1 L  Objective: Vitals:   11/28/19 1130 11/28/19 1133 11/28/19 1136 11/28/19 1140  BP:      Pulse:      Resp:      Temp:      TempSrc:      SpO2: 93% (!) 78% 93% 93%  Weight:      Height:        Intake/Output Summary (Last 24 hours) at 11/28/2019 1238 Last data filed at 11/28/2019 1000 Gross per 24 hour  Intake 120 ml  Output 1350 ml  Net -1230 ml   Filed Weights   11/25/19 1432  Weight: 90.7 kg    Examination:  Awake coherent pleasant moderate dentition EOMI NCAT no focal deficit Wheezy bilaterally posterolaterally decreased lung sounds bilaterally with some crackles Abdomen obese nontender nondistended ROM intact Power 5/5 no focal deficit Neurologically intact   Data Reviewed: I have personally reviewed following labs and imaging studies  CBC: Recent Labs  Lab 11/25/19 1414 11/27/19 0543  WBC 5.8 7.5  NEUTROABS 4.6 4.3  HGB 12.4 11.1*  HCT 36.8 34.3*  MCV 104.5* 109.9*  PLT 248 638   Basic Metabolic Panel: Recent Labs  Lab 11/25/19 1414 11/26/19 0040 11/27/19 0543 11/28/19 0954  NA 136 137 142  --   K 3.5 3.7 3.3*  --   CL 105 106 112*  --   CO2 19* 18* 22  --   GLUCOSE 227* 216* 113*  --   BUN '20 17 19  ' --   CREATININE 0.73 0.63 0.61  --   CALCIUM 8.8* 8.5* 8.4*  --   MG  --   --  2.2 1.8   GFR: Estimated Creatinine Clearance: 80.9 mL/min (by C-G formula based on SCr of 0.61 mg/dL). Liver Function Tests: No results for input(s): AST, ALT, ALKPHOS, BILITOT, PROT, ALBUMIN in the last 168 hours. No results for input(s): LIPASE, AMYLASE in the last 168 hours. No results for input(s): AMMONIA in the last 168 hours. Coagulation Profile: No results for input(s): INR, PROTIME in the last 168 hours. Cardiac Enzymes: No results for input(s): CKTOTAL, CKMB, CKMBINDEX, TROPONINI in  the last 168 hours. BNP (last 3 results) No results for input(s): PROBNP in the last 8760 hours. HbA1C: No results for input(s): HGBA1C in the last 72 hours. CBG: No results for input(s): GLUCAP in the last 168 hours. Lipid Profile: No results for input(s): CHOL, HDL, LDLCALC, TRIG, CHOLHDL, LDLDIRECT in the last 72 hours. Thyroid Function Tests: No results for input(s): TSH, T4TOTAL, FREET4, T3FREE, THYROIDAB in the last 72 hours. Anemia Panel: No results  for input(s): VITAMINB12, FOLATE, FERRITIN, TIBC, IRON, RETICCTPCT in the last 72 hours. Sepsis Labs: Recent Labs  Lab 11/26/19 0040  PROCALCITON <0.10    Recent Results (from the past 240 hour(s))  SARS Coronavirus 2 by RT PCR (hospital order, performed in [facility] hospital lab) Nasopharyngeal Nasopharyngeal Swab     Status: None   Collection Time: 11/25/19  2:15 PM   Specimen: Nasopharyngeal Swab  Result Value Ref Range Status   SARS Coronavirus 2 NEGATIVE NEGATIVE Final    Comment: (NOTE) SARS-CoV-2 target nucleic acids are NOT DETECTED.  The SARS-CoV-2 RNA is generally detectable in upper and lower respiratory specimens during the acute phase of infection. The lowest concentration of SARS-CoV-2 viral copies this assay can detect is 250 copies / mL. A negative result does not preclude SARS-CoV-2 infection and should not be used as the sole basis for treatment or other patient management decisions.  A negative result may occur with improper specimen collection / handling, submission of specimen other than nasopharyngeal swab, presence of viral mutation(s) within the areas targeted by this assay, and inadequate number of viral copies (<250 copies / mL). A negative result must be combined with clinical observations, patient history, and epidemiological information.  Fact Sheet for Patients:   StrictlyIdeas.no  Fact Sheet for Healthcare  Providers: BankingDealers.co.za  This test is not yet approved or  cleared by the [place] FDA and has been authorized for detection and/or diagnosis of SARS-CoV-2 by FDA under an Emergency Use Authorization (EUA).  This EUA will remain in effect (meaning this test can be used) for the duration of the COVID-19 declaration under Section 564(b)(1) of the Act, 21 U.S.C. section 360bbb-3(b)(1), unless the authorization is terminated or revoked sooner.  Performed at [facility], [place] [place], [place] [address]   Respiratory Panel by PCR     Status: None   Collection Time: 11/25/19 11:00 PM   Specimen: Nasopharyngeal Swab; Respiratory  Result Value Ref Range Status   Adenovirus NOT DETECTED NOT DETECTED Final   Coronavirus 229E NOT DETECTED NOT DETECTED Final    Comment: (NOTE) The Coronavirus on the Respiratory Panel, DOES NOT test for the novel  Coronavirus (2019 nCoV)    Coronavirus HKU1 NOT DETECTED NOT DETECTED Final   Coronavirus NL63 NOT DETECTED NOT DETECTED Final   Coronavirus OC43 NOT DETECTED NOT DETECTED Final   Metapneumovirus NOT DETECTED NOT DETECTED Final   Rhinovirus / Enterovirus NOT DETECTED NOT DETECTED Final   Influenza A NOT DETECTED NOT DETECTED Final   Influenza B NOT DETECTED NOT DETECTED Final   Parainfluenza Virus 1 NOT DETECTED NOT DETECTED Final   Parainfluenza Virus 2 NOT DETECTED NOT DETECTED Final   Parainfluenza Virus 3 NOT DETECTED NOT DETECTED Final   Parainfluenza Virus 4 NOT DETECTED NOT DETECTED Final   Respiratory Syncytial Virus NOT DETECTED NOT DETECTED Final   Bordetella pertussis NOT DETECTED NOT DETECTED Final   Chlamydophila pneumoniae NOT DETECTED NOT DETECTED Final   Mycoplasma pneumoniae NOT DETECTED NOT DETECTED Final    Comment: Performed at [facility], [place]. [address], [place], [place] [address]  Culture, blood (routine x 2)     Status: None (Preliminary result)   Collection  Time: 11/26/19 12:40 AM   Specimen: BLOOD  Result Value Ref Range Status   Specimen Description   Final    BLOOD RIGHT ANTECUBITAL Performed at [place] [address], [place],  [address]  Special Requests   Final    BOTTLES DRAWN AEROBIC ONLY Blood Culture adequate volume Performed at [place] [address], [place], [place] [address]    Culture   Final    NO GROWTH 2 DAYS Performed at [facility] [address] [place], [place] [address]    Report Status PENDING  Incomplete  Culture, blood (routine x 2)     Status: None (Preliminary result)   Collection Time: 11/26/19 12:40 AM   Specimen: BLOOD RIGHT FOREARM  Result Value Ref Range Status   Specimen Description   Final    BLOOD RIGHT FOREARM Performed at [facility], [place] [address] [place], [place] [address]    Special Requests   Final    BOTTLES DRAWN AEROBIC ONLY Blood Culture adequate volume Performed at [place] [address], [place], [place] [address]    Culture   Final    NO GROWTH 2 DAYS Performed at [place] [address] [place],  [address]    Report Status PENDING  Incomplete      Radiology Studies: No results found.  Scheduled Meds: . azithromycin  500 mg Oral QHS  . budesonide (PULMICORT) nebulizer solution  0.25 mg Nebulization BID  . buPROPion  300 mg Oral Daily  . enoxaparin (LOVENOX) injection  40 mg Subcutaneous Q24H  . folic acid  1 mg Oral Daily  . ipratropium-albuterol  3 mL Nebulization TID  . magnesium oxide  400 mg Oral BID  . montelukast  10 mg Oral QHS  . potassium chloride  40 mEq Oral BID  . predniSONE  40 mg Oral Q breakfast  . sertraline  100 mg Oral Daily  . tiZANidine  4 mg Oral QHS   Continuous Infusions: . sodium chloride 1,000 mL (11/26/19 1119)  . cefTRIAXone (ROCEPHIN)  IV 1 g (11/27/19 2146)     LOS: 3 days   Time spent: 20 minutes   Nita Sells, MD [facility]  11/28/2019, 12:38 PM   To contact the attending provider between 7A-7P or the covering provider during after hours 7P-7A, please log into the web site [email or web address].

## 2019-11-29 ENCOUNTER — Inpatient Hospital Stay (HOSPITAL_COMMUNITY): Payer: BC Managed Care – PPO

## 2019-11-29 DIAGNOSIS — M069 Rheumatoid arthritis, unspecified: Secondary | ICD-10-CM

## 2019-11-29 DIAGNOSIS — R0902 Hypoxemia: Secondary | ICD-10-CM

## 2019-11-29 DIAGNOSIS — J189 Pneumonia, unspecified organism: Secondary | ICD-10-CM

## 2019-11-29 LAB — CBC WITH DIFFERENTIAL/PLATELET
Abs Immature Granulocytes: 0.1 10*3/uL — ABNORMAL HIGH (ref 0.00–0.07)
Basophils Absolute: 0.1 10*3/uL (ref 0.0–0.1)
Basophils Relative: 1 %
Eosinophils Absolute: 0.5 10*3/uL (ref 0.0–0.5)
Eosinophils Relative: 6 %
HCT: 34.2 % — ABNORMAL LOW (ref 36.0–46.0)
Hemoglobin: 11.2 g/dL — ABNORMAL LOW (ref 12.0–15.0)
Immature Granulocytes: 1 %
Lymphocytes Relative: 24 %
Lymphs Abs: 2.1 10*3/uL (ref 0.7–4.0)
MCH: 35.1 pg — ABNORMAL HIGH (ref 26.0–34.0)
MCHC: 32.7 g/dL (ref 30.0–36.0)
MCV: 107.2 fL — ABNORMAL HIGH (ref 80.0–100.0)
Monocytes Absolute: 0.7 10*3/uL (ref 0.1–1.0)
Monocytes Relative: 8 %
Neutro Abs: 5.3 10*3/uL (ref 1.7–7.7)
Neutrophils Relative %: 60 %
Platelets: 300 10*3/uL (ref 150–400)
RBC: 3.19 MIL/uL — ABNORMAL LOW (ref 3.87–5.11)
RDW: 13 % (ref 11.5–15.5)
WBC: 8.7 10*3/uL (ref 4.0–10.5)
nRBC: 0 % (ref 0.0–0.2)

## 2019-11-29 LAB — COMPREHENSIVE METABOLIC PANEL
ALT: 44 U/L (ref 0–44)
AST: 40 U/L (ref 15–41)
Albumin: 3.3 g/dL — ABNORMAL LOW (ref 3.5–5.0)
Alkaline Phosphatase: 49 U/L (ref 38–126)
Anion gap: 8 (ref 5–15)
BUN: 12 mg/dL (ref 6–20)
CO2: 22 mmol/L (ref 22–32)
Calcium: 8.6 mg/dL — ABNORMAL LOW (ref 8.9–10.3)
Chloride: 108 mmol/L (ref 98–111)
Creatinine, Ser: 0.68 mg/dL (ref 0.44–1.00)
GFR calc Af Amer: >60 mL/min (ref >60)
GFR calc non Af Amer: >60 mL/min (ref >60)
Glucose, Bld: 105 mg/dL — ABNORMAL HIGH (ref 70–99)
Potassium: 3.2 mmol/L — ABNORMAL LOW (ref 3.5–5.1)
Sodium: 138 mmol/L (ref 135–145)
Total Bilirubin: 0.3 mg/dL (ref 0.3–1.2)
Total Protein: 6.4 g/dL — ABNORMAL LOW (ref 6.5–8.1)

## 2019-11-29 LAB — MAGNESIUM: Magnesium: 2 mg/dL (ref 1.7–2.4)

## 2019-11-29 MED ORDER — LEVOFLOXACIN 500 MG PO TABS
500.0000 mg | ORAL_TABLET | Freq: Every day | ORAL | Status: DC
  Administered 2019-11-29: 500 mg via ORAL
  Filled 2019-11-29: qty 1

## 2019-11-29 MED ORDER — HYDROCODONE-HOMATROPINE 5-1.5 MG/5ML PO SYRP
5.0000 mL | ORAL_SOLUTION | ORAL | Status: DC | PRN
  Administered 2019-11-29 – 2019-12-02 (×17): 5 mL via ORAL
  Filled 2019-11-29 (×17): qty 5

## 2019-11-29 MED ORDER — POTASSIUM CHLORIDE CRYS ER 20 MEQ PO TBCR
60.0000 meq | EXTENDED_RELEASE_TABLET | Freq: Two times a day (BID) | ORAL | Status: DC
  Administered 2019-11-29 (×2): 60 meq via ORAL
  Filled 2019-11-29 (×2): qty 3

## 2019-11-29 NOTE — Consult Note (Addendum)
NAME:  Dana Reyes, MRN:  [id number], DOB:  [birth year], LOS: 4 ADMISSION DATE:  11/25/2019, CONSULTATION DATE:  11/29/19 REFERRING MD:  Rhetta Mura, MD CHIEF COMPLAINT:  DOE   Brief History   59 year old woman with possible history of well-controlled asthma, rheumatoid arthritis on methotrexate and etanercept who presented to the ED with acute to subacute worsening dyspnea on exertion despite outpatient prednisone therapy for presumed asthma exacerbation whom we are seen in consultation at the request of Rhetta Mura, MD for evaluation of ongoing dyspnea exertion, acute hypoxemic respiratory failure, and abnormal CT scan.  History of present illness   Patient was given a set of health until couple weeks ago.  Developed relatively acute onset of worsening dyspnea on exertion.  She felt this was due to her underlying asthma.  At baseline she uses Advair and albuterol as needed.  Usually very well controlled disease with minimal albuterol use.  However given her history was prescribed steroids and completed a course of steroids a few days ago.  Despite oral prednisone, she did not feel any better.  Differently than prior asthma exacerbations, her dyspnea exertion was markedly worse than normal and she had no worsening of her symptoms when lying flat which typically occurs in the setting of asthma exacerbation.  Since he was not getting better, she presented to the ED where she was given IV Solu-Medrol and breathing treatments.  Mild improvement in symptoms.  Chest x-ray demonstrated on my interpretation revealed relatively clear lungs.  Less concern for PE so CTA was obtained which demonstrated bilateral upper lobe and anterior and lateral distribution groundglass opacities with relative sparing of bilateral lower lobes, and less involvement of right middle lobe and lingula with linear bandlike opacities in bilateral bases.  Eosinophils on initial CBC were 0.  Review of recent CBCs  demonstrate absolute eosinophil count of 200-300 earlier in 2021.  She continues on oral steroids prednisone 40 mg daily.  Repeat CBC 2 days later showed absolute eosinophil 400, CBC 2 days after that demonstrates absolute eosinophil count of 500.  Sed rate elevated at 64 and above more recent numbers in 10-15 range via Care Everywhere. On review of systems she reports well-controlled joint disease.  Review of records in Care Everywhere demonstrate negative rheumatoid factor and anti-CCP titers within normal limits although was 3 years ago.  Has not been checked since.  She reports strong history of seasonal allergies on a bit worsening of sinus pressure and congestion over the last 2 to 4 weeks.  Past Medical History  RA  Significant Hospital Events   9/5 admitted received IV Solu-Medrol Continued oral prednisone and finishing course of oral antibiotics for presumed CAP  Consults:  Pulmonary  Procedures:  N/A  Significant Diagnostic Tests:  Chest x-ray 9/5 relatively clear CT chest 9/5 with bilateral upper lobe anterior distribution groundglass opacities with bilateral linear basilar opacity Chest x-ray 9/9 with worsened interstitial opacities  Micro Data:  Respiratory viral panel negative Antimicrobials:  Completing course of oral levofloxacin for CAP  Interim history/subjective:  Since initiation of steroid she feels a bit better.  She cannot speak in full sentences which was unsure prior.  She still has significant and debilitating dyspnea exertion when walking across room just going to bathroom.   Objective   Blood pressure (!) 150/99, pulse (!) 106, temperature 98.6 F (37 C), temperature source Oral, resp. rate 16, height 5\' 3"  (1.6 m), weight 90.7 kg, SpO2 94 %.  Intake/Output Summary (Last 24 hours) at 11/29/2019 1741 Last data filed at 11/29/2019 1658 Gross per 24 hour  Intake 240 ml  Output 2100 ml  Net -1860 ml   Filed Weights   11/25/19 1432  Weight: 90.7  kg    Examination: General: Sitting up in bed in bed, in no acute distress Eyes: EOMI, no icterus Neck: No JVP appreciated sitting upright, supple Respiratory: Fine bibasilar crackles that clear in mid lung no crackles heard elsewhere posteriorly but fine crackles in bilateral upper lung fields anteriorly appreciated on inspiration, no wheeze Cardiovascular: Regular rate and rhythm, no murmurs Abdomen: Nondistended, bowel sounds present  extremities: Warm, no edema Neuro: No weakness, cranial nerves intact Psych: Normal mood, full affect   Resolved Hospital Problem list   N/A  Assessment & Plan:  Acute hypoxemic respiratory failure, infiltrates on CT scan: Symptoms not typical for asthma exacerbations and different than prior asthma exacerbations.  Infiltrates on CT and chest x-ray not consistent with pure asthma exacerbation.  The pattern of infiltrate on CT scan in bilateral upper lobes in the relatively anterior and lateral fashion are most suspicious for noninfectious pneumonitis.  More convincingly, RVP negative.  She has no productive cough no fever.  No identifiable risk factor for hypersensitivity pneumonitis.  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  RA-ILD also considered but this typically is associated with elevated rheumatoid titers (negative in the past) and typically presents in UIP pattern which is not consistent with current imaging.  She also has peripheral eosinophilia which can be seen in the setting of methotrexate lung toxicity.  Other causes of eosinophilia are considered but felt unlikely.  She has sinusitis so EGPA is possible but again felt unlikely.  Admittedly it is a bit puzzling why she has persistent eosinophilia despite systemic prednisone administration.  Discussed possibility of bronchoscopy with patient and admitted it is low yield given she has been on steroids for several days and  antibiotics for several days.  After shared decision-making elected not to pursue additional invasive testing and move forward with empiric therapy and imaging and symptom surveillance. 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, taper to be determined, anticipate fairly long taper and need for P JP prophylaxis --Discontinue MTX --ANCA, RF, anti-CCP ordered for the morning-if all negative this argues against vasculitis and RA-ILD --Will arrange outpatient follow up  Best practice:  Per primary  Labs   Personally reviewed and as per EMR and discussion in this note CBC: Recent Labs  Lab 11/25/19 1414 11/27/19 0543 11/29/19 0611  WBC 5.8 7.5 8.7  NEUTROABS 4.6 4.3 5.3  HGB 12.4 11.1* 11.2*  HCT 36.8 34.3* 34.2*  MCV 104.5* 109.9* 107.2*  PLT 248 263 300    Basic Metabolic Panel: Recent Labs  Lab 11/25/19 1414 11/26/19 0040 11/27/19 0543 11/28/19 0954 11/29/19 0611  NA 136 137 142  --  138  K 3.5 3.7 3.3*  --  3.2*  CL 105 106 112*  --  108  CO2 19* 18* 22  --  22  GLUCOSE 227* 216* 113*  --  105*  BUN 20 17 19   --  12  CREATININE 0.73 0.63 0.61  --  0.68  CALCIUM 8.8* 8.5* 8.4*  --  8.6*  MG  --   --  2.2 1.8 2.0   GFR: Estimated Creatinine Clearance: 80.9 mL/min (by C-G formula based  on SCr of 0.68 mg/dL). Recent Labs  Lab 11/25/19 1414 11/26/19 0040 11/27/19 0543 11/29/19 0611  PROCALCITON  --  <0.10  --   --   WBC 5.8  --  7.5 8.7    Liver Function Tests: Recent Labs  Lab 11/29/19 0611  AST 40  ALT 44  ALKPHOS 49  BILITOT 0.3  PROT 6.4*  ALBUMIN 3.3*   No results for input(s): LIPASE, AMYLASE in the last 168 hours. No results for input(s): AMMONIA in the last 168 hours.  ABG No results found for: PHART, PCO2ART, PO2ART, HCO3, TCO2, ACIDBASEDEF, O2SAT   Coagulation Profile: No results for input(s): INR, PROTIME in the last 168 hours.  Cardiac Enzymes: No results for input(s): CKTOTAL,  CKMB, CKMBINDEX, TROPONINI in the last 168 hours.  HbA1C: No results found for: HGBA1C  CBG: No results for input(s): GLUCAP in the last 168 hours.  Review of Systems:   No orthopnea or PND.  No lower extremity swelling.  Has nonproductive cough.  Comprehensive review of systems otherwise negative.  Past Medical History  She,  has a past medical history of Anxiety, Asthma, Cardiac dysrhythmia, Depression, Hypertension, and RA (rheumatoid arthritis) ([facility]).   Surgical History    Past Surgical History:  Procedure Laterality Date  . TOTAL HIP ARTHROPLASTY Right      Social History   reports that she has never smoked. She has never used smokeless tobacco. She reports current alcohol use. She reports that she does not use drugs.   Family History   Her family history includes Breast cancer in her mother; CAD in her father.   Allergies Allergies  Allergen Reactions  . Cephalexin Hives  . Gabapentin Itching and Other (See Comments)    Increase in anxiety and depression   . Naproxen Diarrhea and Nausea And Vomiting  . Pregabalin Other (See Comments)    Increase in anxiety and depression      . Sulphamethoxydiazine Other (See Comments)    Unknown/ Turn red on operating table  . Penicillins Rash    10 years ago     Home Medications  Prior to Admission medications   Medication Sig Start Date End Date Taking? Authorizing Provider  acetaminophen (TYLENOL) 325 MG tablet Take 650 mg by mouth daily as needed for mild pain or headache.  01/26/18  Yes [provider]  albuterol (PROVENTIL) (2.5 MG/3ML) 0.083% nebulizer solution Take 2.5 mg by nebulization daily as needed. 11/24/19  Yes [provider]  buPROPion (WELLBUTRIN XL) 300 MG 24 hr tablet Take 300 mg by mouth daily. 10/15/19  Yes [provider]  diazepam (VALIUM) 5 MG tablet Take 5 mg by mouth every 6 (six) hours as needed for anxiety.   Yes [provider]  EPINEPHrine 0.3 mg/0.3 mL IJ SOAJ  injection Inject 0.3 mg into the muscle as needed. 11/29/14  Yes [provider]  etanercept (ENBREL SURECLICK) 50 MG/ML injection Inject 50 mg into the skin once a week. Wednesday 09/24/19  Yes [provider]  fexofenadine (ALLEGRA) 180 MG tablet Take 180 mg by mouth daily.   Yes [provider]  Fluticasone-Salmeterol (ADVAIR DISKUS) 250-50 MCG/DOSE AEPB Inhale 1 puff into the lungs daily. 03/09/19  Yes [provider]  folic acid (FOLVITE) 1 MG tablet Take 1 mg by mouth daily. 11/09/19  Yes [provider]  HYDROcodone-acetaminophen (NORCO) 10-325 MG tablet Take 1 tablet by mouth every 8 (eight) hours as needed. 11/21/19  Yes [provider]  lisinopril (  ZESTRIL) 10 MG tablet Take 10 mg by mouth at bedtime. 11/07/19  Yes [provider]  meloxicam (MOBIC) 15 MG tablet Take 15 mg by mouth daily. 10/19/19  Yes [provider]  methotrexate (RHEUMATREX) 2.5 MG tablet Take 15 mg by mouth once a week. Wednesday 06/05/19  Yes [provider]  montelukast (SINGULAIR) 10 MG tablet Take 10 mg by mouth at bedtime. 10/29/19  Yes [provider]  nystatin (MYCOSTATIN) 100000 UNIT/ML suspension Take 5 mLs by mouth daily as needed (Canker sore).  10/17/19  Yes [provider]  Omega-3 Fatty Acids (FISH OIL) 1000 MG CAPS Take 2,000 Units by mouth at bedtime.   Yes [provider]  polyethylene glycol powder (GLYCOLAX/MIRALAX) 17 GM/SCOOP powder Take 17 g by mouth daily as needed for constipation. 01/26/18  Yes [provider]  pseudoephedrine (SUDAFED) 30 MG tablet Take 30 mg by mouth daily as needed for congestion.    Yes [provider]  sertraline (ZOLOFT) 100 MG tablet Take 100 mg by mouth daily. 06/23/19  Yes [provider]  tiZANidine (ZANAFLEX) 4 MG tablet Take 4 mg by mouth at bedtime. 11/19/19  Yes [provider]

## 2019-11-29 NOTE — Progress Notes (Signed)
PROGRESS NOTE    Dana Reyes  CHY:[id number] DOB: 1960-11-04 DOA: 11/25/2019 PCP: Jettie Booze, NP  Brief Narrative:   59 y.o.asthma diagnosed in 2002 on inhalers and nebulizers, hypertension, rheumatoid arthritis follows at [facility] currently on Enbrel in addition to methotrexate once weekly  Recent Rx asthma exacerbation steroid burst  Came back to emergency room 9/5 Denies fevers.  States she has been using her home asthma medications including Advair, Singulair, and albuterol inhaler but they are not helping  Sats found to be 84% Rx Solu-Medrol continuous inhalers etc. Covid negative  CT chest negative for PE but consistent with possible pneumonia/pneumonitis versus drug toxicity  Patient was negative for any other viral upper respiratory infection as well as influenza.    She was started on ceftriaxone, azithromycin and p.o. prednisone.  Her wheezes improved but she still has bilateral rhonchi and is still feels short of breath.  Still on 2 L oxygen.   Assessment & Plan:   Principal Problem:   Asthma exacerbation Active Problems:   Pneumonia   Metabolic acidosis   Hypertension   Rheumatoid arthritis ([place])   Acute hypoxic respiratory failure and sepsis secondary to acute asthma exacerbation and community-acquired pneumonia:  Differential diagnosis possibly methotrexate lung??  Patient met sepsis greater based on tachycardia and tachypnea and clear source of infiltrate/pneumonia on the CT chest initially on admission However given procalcitonin unremarkable.  Pneumo Legionella negative negative respiratory panel It is felt that on repeat x-ray 9/9 which is a 2 view there is no active focal consolidation and I think we need to rule out methotrexate lung toxicity   I have discontinued IV antibiotics in favor of Levaquin for 2 more doses Kindly requeste pulmonology to see the patient in consult today and we may need to transition to 1 mg/kg of steroids and have  them continue to follow her in the outpatient setting We will await their input but at this time would continue prednisone 40 She is somewhat improved but was coughing severely earlier this morning and last night I would discontinue Tussionex and place her on Hycodan syrup for now  Normal anion gap metabolic acidosis: Likely due to dehydration.  resolved-CO2 now 22  Mild hypokalemia Increased replacement to K. [place] 3 times daily Magnesium was replaced with oral Mag-Ox and is now improved at 2  Pulmonary nodule: CT showing a 3 mm subpleural nodule in the superior segment of the left lower lobe. -Ensure PCP follow-up for noncontrast chest CT within 12 months  Essential hypertension: Currently normotensive.  Takes lisinopril at home which has been held.  Rheumatoid arthritis on methotrexate and Enbrel 50 Her CRP and ESR were elevated on admission She follows with [facility] I have instructed her that she may need further tailoring of her DMARD and would probably not discharge her on methotrexate  BMI 35 Outpatient management  DVT prophylaxis: enoxaparin (LOVENOX) injection 40 mg Start: 11/25/19 2315   Code Status: Not on file  Family Communication: N patient updated at bedside at length  Status is: Inpatient  Remains inpatient appropriate because:Inpatient level of care appropriate due to severity of illness   Dispo: The patient is from: Home              Anticipated d/c is to: Home              Anticipated d/c date is: 2 days              Patient currently is not  medically stable to d/c.   Consultants:   None  Procedures:   None  Antimicrobials:  Anti-infectives (From admission, onward)   Start     Dose/Rate Route Frequency Ordered Stop   11/29/19 1000  levofloxacin (LEVAQUIN) tablet 500 mg        500 mg Oral Daily 11/29/19 0857     11/28/19 2200  azithromycin (ZITHROMAX) tablet 500 mg  Status:  Discontinued        500 mg Oral Daily at bedtime 11/28/19  0743 11/29/19 0857   11/26/19 1800  azithromycin (ZITHROMAX) 500 mg in sodium chloride 0.9 % 250 mL IVPB  Status:  Discontinued        500 mg 250 mL/hr over 60 Minutes Intravenous Every 24 hours 11/25/19 2302 11/28/19 0743   11/26/19 1700  cefTRIAXone (ROCEPHIN) 1 g in sodium chloride 0.9 % 100 mL IVPB  Status:  Discontinued        1 g 200 mL/hr over 30 Minutes Intravenous Every 24 hours 11/25/19 2302 11/29/19 0857   11/25/19 1700  cefTRIAXone (ROCEPHIN) 1 g in sodium chloride 0.9 % 100 mL IVPB        1 g 200 mL/hr over 30 Minutes Intravenous  Once 11/25/19 1656 11/25/19 1753   11/25/19 1700  azithromycin (ZITHROMAX) 500 mg in sodium chloride 0.9 % 250 mL IVPB        500 mg 250 mL/hr over 60 Minutes Intravenous  Once 11/25/19 1656 11/25/19 1930         Subjective:  Patient coughed the whole night and has abdominal and chest pain She has no fever She feels like she "just cannot get up" the phlegm No nausea no vomiting No rash or fever  Objective: Vitals:   11/28/19 2057 11/29/19 0552 11/29/19 0909 11/29/19 0917  BP: (!) 141/68 (!) 145/80    Pulse: (!) 108 97    Resp: 16 14    Temp: 98.5 F (36.9 C) 98 F (36.7 C)    TempSrc: Oral Oral    SpO2: 93% 92% (!) 89% (!) 89%  Weight:      Height:        Intake/Output Summary (Last 24 hours) at 11/29/2019 1343 Last data filed at 11/29/2019 1258 Gross per 24 hour  Intake 480 ml  Output 1100 ml  Net -620 ml   Filed Weights   11/25/19 1432  Weight: 90.7 kg    Examination:  Awake coherent pleasant moderate dentition EOMI NCAT no focal deficit Bilateral rails rhonchi posterolaterally in the lung fields without acute wheeze Abdomen obese nontender nondistended ROM intact Power 5/5 no focal deficit Neurologically intact   Data Reviewed: I have personally reviewed following labs and imaging studies  CBC: Recent Labs  Lab 11/25/19 1414 11/27/19 0543 11/29/19 0611  WBC 5.8 7.5 8.7  NEUTROABS 4.6 4.3 5.3  HGB 12.4  11.1* 11.2*  HCT 36.8 34.3* 34.2*  MCV 104.5* 109.9* 107.2*  PLT 248 263 242   Basic Metabolic Panel: Recent Labs  Lab 11/25/19 1414 11/26/19 0040 11/27/19 0543 11/28/19 0954 11/29/19 0611  NA 136 137 142  --  138  K 3.5 3.7 3.3*  --  3.2*  CL 105 106 112*  --  108  CO2 19* 18* 22  --  22  GLUCOSE 227* 216* 113*  --  105*  BUN _0 --  12  CREATININE 0.73 0.63 0.61  --  0.68  CALCIUM 8.8* 8.5* 8.4*  --  8.6*  MG  --   --  2.2 1.8 2.0   GFR: Estimated Creatinine Clearance: 80.9 mL/min (by C-G formula based on SCr of 0.68 mg/dL). Liver Function Tests: Recent Labs  Lab 11/29/19 0611  AST 40  ALT 44  ALKPHOS 49  BILITOT 0.3  PROT 6.4*  ALBUMIN 3.3*   No results for input(s): LIPASE, AMYLASE in the last 168 hours. No results for input(s): AMMONIA in the last 168 hours. Coagulation Profile: No results for input(s): INR, PROTIME in the last 168 hours. Cardiac Enzymes: No results for input(s): CKTOTAL, CKMB, CKMBINDEX, TROPONINI in the last 168 hours. BNP (last 3 results) No results for input(s): PROBNP in the last 8760 hours. HbA1C: No results for input(s): HGBA1C in the last 72 hours. CBG: No results for input(s): GLUCAP in the last 168 hours. Lipid Profile: No results for input(s): CHOL, HDL, LDLCALC, TRIG, CHOLHDL, LDLDIRECT in the last 72 hours. Thyroid Function Tests: No results for input(s): TSH, T4TOTAL, FREET4, T3FREE, THYROIDAB in the last 72 hours. Anemia Panel: No results for input(s): VITAMINB12, FOLATE, FERRITIN, TIBC, IRON, RETICCTPCT in the last 72 hours. Sepsis Labs: Recent Labs  Lab 11/26/19 0040  PROCALCITON <0.10    Recent Results (from the past 240 hour(s))  SARS Coronavirus 2 by RT PCR (hospital order, performed in [facility] hospital lab) Nasopharyngeal Nasopharyngeal Swab     Status: None   Collection Time: 11/25/19  2:15 PM   Specimen: Nasopharyngeal Swab  Result Value Ref Range Status   SARS Coronavirus 2 NEGATIVE NEGATIVE  Final    Comment: (NOTE) SARS-CoV-2 target nucleic acids are NOT DETECTED.  The SARS-CoV-2 RNA is generally detectable in upper and lower respiratory specimens during the acute phase of infection. The lowest concentration of SARS-CoV-2 viral copies this assay can detect is 250 copies / mL. A negative result does not preclude SARS-CoV-2 infection and should not be used as the sole basis for treatment or other patient management decisions.  A negative result may occur with improper specimen collection / handling, submission of specimen other than nasopharyngeal swab, presence of viral mutation(s) within the areas targeted by this assay, and inadequate number of viral copies (<250 copies / mL). A negative result must be combined with clinical observations, patient history, and epidemiological information.  Fact Sheet for Patients:   StrictlyIdeas.no  Fact Sheet for Healthcare Providers: BankingDealers.co.za  This test is not yet approved or  cleared by the [place] FDA and has been authorized for detection and/or diagnosis of SARS-CoV-2 by FDA under an Emergency Use Authorization (EUA).  This EUA will remain in effect (meaning this test can be used) for the duration of the COVID-19 declaration under Section 564(b)(1) of the Act, 21 U.S.C. section 360bbb-3(b)(1), unless the authorization is terminated or revoked sooner.  Performed at [facility], [address] [place], [place] [address]   Respiratory Panel by PCR     Status: None   Collection Time: 11/25/19 11:00 PM   Specimen: Nasopharyngeal Swab; Respiratory  Result Value Ref Range Status   Adenovirus NOT DETECTED NOT DETECTED Final   Coronavirus 229E NOT DETECTED NOT DETECTED Final    Comment: (NOTE) The Coronavirus on the Respiratory Panel, DOES NOT test for the novel  Coronavirus (2019 nCoV)    Coronavirus HKU1 NOT DETECTED NOT DETECTED Final   Coronavirus  NL63 NOT DETECTED NOT DETECTED Final   Coronavirus OC43 NOT DETECTED NOT DETECTED Final   Metapneumovirus NOT DETECTED NOT DETECTED Final   Rhinovirus /  Enterovirus NOT DETECTED NOT DETECTED Final   Influenza A NOT DETECTED NOT DETECTED Final   Influenza B NOT DETECTED NOT DETECTED Final   Parainfluenza Virus 1 NOT DETECTED NOT DETECTED Final   Parainfluenza Virus 2 NOT DETECTED NOT DETECTED Final   Parainfluenza Virus 3 NOT DETECTED NOT DETECTED Final   Parainfluenza Virus 4 NOT DETECTED NOT DETECTED Final   Respiratory Syncytial Virus NOT DETECTED NOT DETECTED Final   Bordetella pertussis NOT DETECTED NOT DETECTED Final   Chlamydophila pneumoniae NOT DETECTED NOT DETECTED Final   Mycoplasma pneumoniae NOT DETECTED NOT DETECTED Final    Comment: Performed at [facility], [place] [address] [place], [place] [address]  Culture, blood (routine x 2)     Status: None (Preliminary result)   Collection Time: 11/26/19 12:40 AM   Specimen: BLOOD  Result Value Ref Range Status   Specimen Description   Final    BLOOD RIGHT ANTECUBITAL Performed at [place] [address], [place], [place] [address]    Special Requests   Final    BOTTLES DRAWN AEROBIC ONLY Blood Culture adequate volume Performed at [place] [address], [place], [place] [address]    Culture   Final    NO GROWTH 3 DAYS Performed at [facility], [place] [address], [place], [place] [address]    Report Status PENDING  Incomplete  Culture, blood (routine x 2)     Status: None (Preliminary result)   Collection Time: 11/26/19 12:40 AM   Specimen: BLOOD RIGHT FOREARM  Result Value Ref Range Status   Specimen Description   Final    BLOOD RIGHT FOREARM Performed at [facility], [place] [address], [place], [place] [address]    Special Requests   Final    BOTTLES DRAWN AEROBIC ONLY Blood Culture adequate volume Performed at [place]  [address] [place], [place] [address]    Culture   Final    NO GROWTH 3 DAYS Performed at [facility], [place] [address], [place], [place] [address]    Report Status PENDING  Incomplete      Radiology Studies: DG Chest 2 View  Result Date: 11/29/2019 CLINICAL DATA:  Cough for 1 week EXAM: CHEST - 2 VIEW COMPARISON:  11/25/2019 FINDINGS: Cardiac shadow is stable. The lungs are well aerated bilaterally. Increased patchy airspace opacities are noted bilaterally these have increased slightly in the interval from the recent CT examination. No sizable effusion is noted. No bony abnormality is seen. Postsurgical changes in the cervical spine are noted. IMPRESSION: Increase in the degree of interstitial opacities when compare with the prior exam. Electronically Signed   By: Inez Catalina M.D.   On: 11/29/2019 10:06    Scheduled Meds: . budesonide (PULMICORT) nebulizer solution  0.25 mg Nebulization BID  . buPROPion  300 mg Oral Daily  . enoxaparin (LOVENOX) injection  40 mg Subcutaneous Q24H  . folic acid  1 mg Oral Daily  . ipratropium-albuterol  3 mL Nebulization TID  . levofloxacin  500 mg Oral Daily  . magnesium oxide  400 mg Oral BID  . montelukast  10 mg Oral QHS  . potassium chloride  60 mEq Oral BID  . predniSONE  40 mg Oral Q breakfast  . sertraline  100 mg Oral Daily  . tiZANidine  4 mg Oral QHS   Continuous Infusions: . sodium chloride 1,000 mL (11/26/19 1119)     LOS:  4 days   Time spent: 37 minutes including discussion with patient and care coordination time discussion with consultants   Nita Sells, MD [facility]  11/29/2019, 1:43 PM   To contact the attending provider between 7A-7P or the covering provider during after hours 7P-7A, please log into the web site [email or web address].

## 2019-11-30 ENCOUNTER — Encounter (HOSPITAL_COMMUNITY)
Admission: EM | Disposition: A | Discharge status: Home / Self Care | Payer: Self-pay | Source: Home / Self Care | Attending: Family Medicine

## 2019-11-30 ENCOUNTER — Other Ambulatory Visit: Payer: Self-pay | Admitting: Pulmonary Disease

## 2019-11-30 DIAGNOSIS — J45901 Unspecified asthma with (acute) exacerbation: Principal | ICD-10-CM

## 2019-11-30 DIAGNOSIS — T451X5A Adverse effect of antineoplastic and immunosuppressive drugs, initial encounter: Secondary | ICD-10-CM

## 2019-11-30 LAB — CBC WITH DIFFERENTIAL/PLATELET
Abs Immature Granulocytes: 0.13 10*3/uL — ABNORMAL HIGH (ref 0.00–0.07)
Basophils Absolute: 0.1 10*3/uL (ref 0.0–0.1)
Basophils Relative: 1 %
Eosinophils Absolute: 0.5 10*3/uL (ref 0.0–0.5)
Eosinophils Relative: 6 %
HCT: 35.4 % — ABNORMAL LOW (ref 36.0–46.0)
Hemoglobin: 11.6 g/dL — ABNORMAL LOW (ref 12.0–15.0)
Immature Granulocytes: 1 %
Lymphocytes Relative: 26 %
Lymphs Abs: 2.3 10*3/uL (ref 0.7–4.0)
MCH: 35 pg — ABNORMAL HIGH (ref 26.0–34.0)
MCHC: 32.8 g/dL (ref 30.0–36.0)
MCV: 106.9 fL — ABNORMAL HIGH (ref 80.0–100.0)
Monocytes Absolute: 0.9 10*3/uL (ref 0.1–1.0)
Monocytes Relative: 10 %
Neutro Abs: 5.1 10*3/uL (ref 1.7–7.7)
Neutrophils Relative %: 56 %
Platelets: 305 10*3/uL (ref 150–400)
RBC: 3.31 MIL/uL — ABNORMAL LOW (ref 3.87–5.11)
RDW: 13.1 % (ref 11.5–15.5)
WBC: 9 10*3/uL (ref 4.0–10.5)
nRBC: 0.2 % (ref 0.0–0.2)

## 2019-11-30 LAB — COMPREHENSIVE METABOLIC PANEL
ALT: 42 U/L (ref 0–44)
AST: 38 U/L (ref 15–41)
Albumin: 3.2 g/dL — ABNORMAL LOW (ref 3.5–5.0)
Alkaline Phosphatase: 44 U/L (ref 38–126)
Anion gap: 11 (ref 5–15)
BUN: 13 mg/dL (ref 6–20)
CO2: 24 mmol/L (ref 22–32)
Calcium: 9.1 mg/dL (ref 8.9–10.3)
Chloride: 105 mmol/L (ref 98–111)
Creatinine, Ser: 0.65 mg/dL (ref 0.44–1.00)
GFR calc Af Amer: >60 mL/min (ref >60)
GFR calc non Af Amer: >60 mL/min (ref >60)
Glucose, Bld: 95 mg/dL (ref 70–99)
Potassium: 4.7 mmol/L (ref 3.5–5.1)
Sodium: 140 mmol/L (ref 135–145)
Total Bilirubin: 0.4 mg/dL (ref 0.3–1.2)
Total Protein: 6.2 g/dL — ABNORMAL LOW (ref 6.5–8.1)

## 2019-11-30 SURGERY — VIDEO BRONCHOSCOPY WITHOUT FLUORO
Anesthesia: Choice | Laterality: Bilateral

## 2019-11-30 MED ORDER — PREDNISONE 20 MG PO TABS
80.0000 mg | ORAL_TABLET | Freq: Every day | ORAL | Status: DC
  Administered 2019-12-01 – 2019-12-02 (×2): 80 mg via ORAL
  Filled 2019-11-30 (×2): qty 4

## 2019-11-30 MED ORDER — PREDNISONE 20 MG PO TABS: 80.0000 mg | ORAL_TABLET | Freq: Every day | ORAL | Status: DC

## 2019-11-30 MED ORDER — PREDNISONE 20 MG PO TABS: 40.0000 mg | ORAL_TABLET | Freq: Once | ORAL | Status: DC

## 2019-11-30 MED ORDER — PREDNISONE 20 MG PO TABS
40.0000 mg | ORAL_TABLET | Freq: Once | ORAL | Status: AC
  Administered 2019-11-30: 40 mg via ORAL
  Filled 2019-11-30: qty 2

## 2019-11-30 NOTE — Progress Notes (Signed)
PROGRESS NOTE    Dana Reyes  GYJ:[id number] DOB: 08-21-1960 DOA: 11/25/2019 PCP: Jettie Booze, NP  Brief Narrative:   59 y.o.asthma diagnosed in 2002 on inhalers and nebulizers, hypertension, rheumatoid arthritis follows at [facility] currently on Enbrel in addition to methotrexate once weekly  Recent Rx asthma exacerbation steroid burst  Came back to emergency room 9/5 Denies fevers.  States she has been using her home asthma medications including Advair, Singulair, and albuterol inhaler but they are not helping  Sats found to be 84% Rx Solu-Medrol continuous inhalers etc. Covid negative  CT chest negative for PE but consistent with possible pneumonia/pneumonitis versus drug toxicity  Patient was negative for any other viral upper respiratory infection as well as influenza.    She was started on ceftriaxone, azithromycin and p.o. prednisone.  Her wheezes improved but she still has bilateral rhonchi and is still feels short of breath.  Still on 2 L oxygen.   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  resolved-CO2 now 22  Mild hypokalemia which is now  resolved Stopped all replacement Screening labs as an outpatient  Pulmonary nodule:   CT showing a 3 mm subpleural nodule in the superior segment of the left lower lobe.  -Ensure PCP follow-up for noncontrast chest CT within 12 months  Can also be followed by pulmonology  Essential hypertension: Currently normotensive.  Takes lisinopril at home which has been held.  Rheumatoid arthritis on methotrexate and Enbrel 50  Her CRP and ESR were elevated on admission  She follows with [facility]ology  I have instructed her that she may need further tailoring of her DMARD and would probably not discharge her on methotrexate  BMI 35 Outpatient management  DVT prophylaxis: enoxaparin (LOVENOX) injection 40 mg Start: 11/25/19 2315   Code Status: Not on file  Family Communication: N patient updated at bedside at length  Status is: Inpatient  Remains inpatient appropriate because:Inpatient level of care appropriate due to severity of illness   Dispo: The patient is from: Home              Anticipated d/c is to: Home              Anticipated d/c date is: 1 day              Patient currently is not medically stable to d/c.   Consultants:   None  Procedures:   None  Antimicrobials:  None currently    Subjective: Cough is improved she has been walking in the room she has no chest pain Sputum is minimal Overall her breathing is better She had an episode where she still needed 4 L of oxygen however She has  not walked in the hallway as yet  Objective: Vitals:   11/29/19 1947 11/30/19 0545 11/30/19 0905 11/30/19 0913  BP: 133/80 128/77    Pulse: (!) 109 86    Resp: 16 16    Temp: 98.8 F (37.1 C) 98 F (36.7 C)    TempSrc: Oral Oral    SpO2: 96% 92% 96% 96%  Weight:      Height:        Intake/Output Summary (Last 24 hours) at 11/30/2019 1017 Last data filed at 11/30/2019 0900 Gross per 24 hour  Intake 240 ml  Output 1500 ml  Net -1260 ml   Filed Weights    11/25/19 1432  Weight: 90.7 kg    Examination:  Awake coherent pleasant moderate dentition EOMI NCAT no focal deficit Decreased air entry decreased rales rhonchi Abdomen obese nontender nondistended ROM intact Power 5/5 no focal deficit Neurologically intact   Data Reviewed: I have personally reviewed following labs and imaging studies  CBC: Recent Labs  Lab 11/25/19 1414 11/27/19 0543 11/29/19 0611 11/30/19 0544  WBC 5.8 7.5 8.7 9.0  NEUTROABS 4.6 4.3 5.3 5.1  HGB 12.4 11.1* 11.2* 11.6*  HCT 36.8 34.3* 34.2* 35.4*  MCV 104.5* 109.9* 107.2* 106.9*  PLT 248 263 300 353   Basic Metabolic Panel: Recent Labs  Lab 11/25/19 1414 11/26/19 0040 11/27/19 0543 11/28/19 0954 11/29/19 0611 11/30/19 0544  NA 136 137 142  --  138 140  K 3.5 3.7 3.3*  --  3.2* 4.7  CL 105 106 112*  --  108 105  CO2 19* 18* 22  --  22 24  GLUCOSE 227* 216* 113*  --  105* 95  BUN '20 17 19  ' --  12 13  CREATININE 0.73 0.63 0.61  --  0.68 0.65  CALCIUM 8.8* 8.5* 8.4*  --  8.6* 9.1  MG  --   --  2.2 1.8 2.0  --    GFR: Estimated Creatinine Clearance: 80.9 mL/min (by C-G formula based on SCr of 0.65 mg/dL). Liver Function Tests: Recent Labs  Lab 11/29/19 0611 11/30/19 0544  AST 40 38  ALT 44 42  ALKPHOS 49 44  BILITOT 0.3 0.4  PROT 6.4* 6.2*  ALBUMIN 3.3* 3.2*   No results for input(s): LIPASE, AMYLASE in the last 168 hours. No results for input(s): AMMONIA in the last 168 hours. Coagulation Profile: No results for input(s): INR, PROTIME in the last 168 hours. Cardiac Enzymes: No results for input(s): CKTOTAL, CKMB, CKMBINDEX, TROPONINI in the last 168 hours. BNP (last 3 results) No results for input(s): PROBNP in the last 8760 hours. HbA1C: No results for input(s): HGBA1C in the last 72 hours. CBG: No results for input(s): GLUCAP in the last 168 hours. Lipid Profile: No results for input(s): CHOL, HDL, LDLCALC, TRIG, CHOLHDL, LDLDIRECT in the last 72 hours. Thyroid Function  Tests: No results for input(s): TSH, T4TOTAL, FREET4, T3FREE, THYROIDAB in the last 72 hours. Anemia Panel: No results for input(s): VITAMINB12, FOLATE, FERRITIN, TIBC, IRON, RETICCTPCT in the last 72 hours. Sepsis Labs: Recent Labs  Lab 11/26/19 0040  PROCALCITON <0.10    Recent Results (from the past 240 hour(s))  SARS Coronavirus 2 by RT PCR (hospital order, performed in [facility] hospital lab) Nasopharyngeal Nasopharyngeal Swab     Status: None   Collection Time: 11/25/19  2:15 PM   Specimen: Nasopharyngeal Swab  Result Value Ref Range Status   SARS Coronavirus 2 NEGATIVE NEGATIVE Final    Comment: (  NOTE) SARS-CoV-2 target nucleic acids are NOT DETECTED.  The SARS-CoV-2 RNA is generally detectable in upper and lower respiratory specimens during the acute phase of infection. The lowest concentration of SARS-CoV-2 viral copies this assay can detect is 250 copies / mL. A negative result does not preclude SARS-CoV-2 infection and should not be used as the sole basis for treatment or other patient management decisions.  A negative result may occur with improper specimen collection / handling, submission of specimen other than nasopharyngeal swab, presence of viral mutation(s) within the areas targeted by this assay, and inadequate number of viral copies (<250 copies / mL). A negative result must be combined with clinical observations, patient history, and epidemiological information.  Fact Sheet for Patients:   StrictlyIdeas.no  Fact Sheet for Healthcare Providers: BankingDealers.co.za  This test is not yet approved or  cleared by the [place] FDA and has been authorized for detection and/or diagnosis of SARS-CoV-2 by FDA under an Emergency Use Authorization (EUA).  This EUA will remain in effect (meaning this test can be used) for the duration of the COVID-19 declaration under Section 564(b)(1) of the Act, 21  U.S.C. section 360bbb-3(b)(1), unless the authorization is terminated or revoked sooner.  Performed at [facility], [address] [place], [place] [address]   Respiratory Panel by PCR     Status: None   Collection Time: 11/25/19 11:00 PM   Specimen: Nasopharyngeal Swab; Respiratory  Result Value Ref Range Status   Adenovirus NOT DETECTED NOT DETECTED Final   Coronavirus 229E NOT DETECTED NOT DETECTED Final    Comment: (NOTE) The Coronavirus on the Respiratory Panel, DOES NOT test for the novel  Coronavirus (2019 nCoV)    Coronavirus HKU1 NOT DETECTED NOT DETECTED Final   Coronavirus NL63 NOT DETECTED NOT DETECTED Final   Coronavirus OC43 NOT DETECTED NOT DETECTED Final   Metapneumovirus NOT DETECTED NOT DETECTED Final   Rhinovirus / Enterovirus NOT DETECTED NOT DETECTED Final   Influenza A NOT DETECTED NOT DETECTED Final   Influenza B NOT DETECTED NOT DETECTED Final   Parainfluenza Virus 1 NOT DETECTED NOT DETECTED Final   Parainfluenza Virus 2 NOT DETECTED NOT DETECTED Final   Parainfluenza Virus 3 NOT DETECTED NOT DETECTED Final   Parainfluenza Virus 4 NOT DETECTED NOT DETECTED Final   Respiratory Syncytial Virus NOT DETECTED NOT DETECTED Final   Bordetella pertussis NOT DETECTED NOT DETECTED Final   Chlamydophila pneumoniae NOT DETECTED NOT DETECTED Final   Mycoplasma pneumoniae NOT DETECTED NOT DETECTED Final    Comment: Performed at [facility], [place]. [address], [place], [place] [address]  Culture, blood (routine x 2)     Status: None (Preliminary result)   Collection Time: 11/26/19 12:40 AM   Specimen: BLOOD  Result Value Ref Range Status   Specimen Description   Final    BLOOD RIGHT ANTECUBITAL Performed at [facility] [address], [place], [place] [address]    Special Requests   Final    BOTTLES DRAWN AEROBIC ONLY Blood Culture adequate volume Performed at [place] [address] [place],  [place] [address]    Culture   Final    NO GROWTH 4 DAYS Performed at [facility], [place] [address], [place], [place] [address]    Report Status PENDING  Incomplete  Culture, blood (routine x 2)     Status: None (Preliminary result)   Collection Time: 11/26/19 12:40 AM   Specimen: BLOOD  RIGHT FOREARM  Result Value Ref Range Status   Specimen Description   Final    BLOOD RIGHT FOREARM Performed at [facility], [address] [place], [place] [address]    Special Requests   Final    BOTTLES DRAWN AEROBIC ONLY Blood Culture adequate volume Performed at [place] [address] [place], [place] [address]    Culture   Final    NO GROWTH 4 DAYS Performed at [facility], [place] [address] [place], [place] [address]    Report Status PENDING  Incomplete      Radiology Studies: DG Chest 2 View  Result Date: 11/29/2019 CLINICAL DATA:  Cough for 1 week EXAM: CHEST - 2 VIEW COMPARISON:  11/25/2019 FINDINGS: Cardiac shadow is stable. The lungs are well aerated bilaterally. Increased patchy airspace opacities are noted bilaterally these have increased slightly in the interval from the recent CT examination. No sizable effusion is noted. No bony abnormality is seen. Postsurgical changes in the cervical spine are noted. IMPRESSION: Increase in the degree of interstitial opacities when compare with the prior exam. Electronically Signed   By: Inez Catalina M.D.   On: 11/29/2019 10:06    Scheduled Meds: . budesonide (PULMICORT) nebulizer solution  0.25 mg Nebulization BID  . buPROPion  300 mg Oral Daily  . enoxaparin (LOVENOX) injection  40 mg Subcutaneous Q24H  . folic acid  1 mg Oral Daily  . ipratropium-albuterol  3 mL Nebulization TID  . montelukast  10 mg Oral QHS  . [START ON 12/01/2019] predniSONE  80 mg Oral Q breakfast  . sertraline  100 mg Oral Daily  . tiZANidine  4 mg Oral QHS   Continuous Infusions: . sodium chloride 1,000 mL (11/26/19 1119)      LOS: 5 days   Time spent: 23 minutes total   Nita Sells, MD [facility]  11/30/2019, 10:17 AM   To contact the attending provider between 7A-7P or the covering provider during after hours 7P-7A, please log into the web site [email or web address].

## 2019-11-30 NOTE — Progress Notes (Addendum)
SATURATION QUALIFICATIONS: (This note is used to comply with regulatory documentation for home oxygen)  Patient Saturations on Room Air at Rest = 89%  Patient Saturations on Room Air while Ambulating = 82%  Patient Saturations on 4 Liters of oxygen while Ambulating = 85%  Please briefly explain why patient needs home oxygen: When ambulating her Oxygen will drop.

## 2019-11-30 NOTE — Plan of Care (Signed)

## 2019-11-30 NOTE — Consult Note (Signed)
NAME:  Dana Reyes, MRN:  [id number], DOB:  07-11-1960, LOS: 5 ADMISSION DATE:  11/25/2019, CONSULTATION DATE:  11/30/19 REFERRING MD:  Rhetta Mura, MD CHIEF COMPLAINT:  DOE   Brief History   59 year old woman with possible history of well-controlled asthma, rheumatoid arthritis on methotrexate and etanercept who presented to the ED with acute to subacute worsening dyspnea on exertion despite outpatient prednisone therapy for presumed asthma exacerbation whom we are seen in consultation at the request of Rhetta Mura, MD for evaluation of ongoing dyspnea exertion, acute hypoxemic respiratory failure, and abnormal CT scan.  History of present illness   Patient was given a set of health until couple weeks ago.  Developed relatively acute onset of worsening dyspnea on exertion.  She felt this was due to her underlying asthma.  At baseline she uses Advair and albuterol as needed.  Usually very well controlled disease with minimal albuterol use.  However given her history was prescribed steroids and completed a course of steroids a few days ago.  Despite oral prednisone, she did not feel any better.  Differently than prior asthma exacerbations, her dyspnea exertion was markedly worse than normal and she had no worsening of her symptoms when lying flat which typically occurs in the setting of asthma exacerbation.  Since he was not getting better, she presented to the ED where she was given IV Solu-Medrol and breathing treatments.  Mild improvement in symptoms.  Chest x-ray demonstrated on my interpretation revealed relatively clear lungs.  Less concern for PE so CTA was obtained which demonstrated bilateral upper lobe and anterior and lateral distribution groundglass opacities with relative sparing of bilateral lower lobes, and less involvement of right middle lobe and lingula with linear bandlike opacities in bilateral bases.  Eosinophils on initial CBC were 0.  Review of recent CBCs  demonstrate absolute eosinophil count of 200-300 earlier in 2021.  She continues on oral steroids prednisone 40 mg daily.  Repeat CBC 2 days later showed absolute eosinophil 400, CBC 2 days after that demonstrates absolute eosinophil count of 500.  Sed rate elevated at 64 and above more recent numbers in 10-15 range via Care Everywhere. On review of systems she reports well-controlled joint disease.  Review of records in Care Everywhere demonstrate negative rheumatoid factor and anti-CCP titers within normal limits although was 3 years ago.  Has not been checked since.  She reports strong history of seasonal allergies on a bit worsening of sinus pressure and congestion over the last 2 to 4 weeks.  Past Medical History  RA  Significant Hospital Events   9/5 admitted received IV Solu-Medrol Continued oral prednisone and finishing course of oral antibiotics for presumed CAP  Consults:  Pulmonary  Procedures:  N/A  Significant Diagnostic Tests:  Chest x-ray 9/5 relatively clear CT chest 9/5 with bilateral upper lobe anterior distribution groundglass opacities with bilateral linear basilar opacity Chest x-ray 9/9 with worsened interstitial opacities  Micro Data:  Respiratory viral panel negative Antimicrobials:  Completing course of oral levofloxacin for CAP  Interim history/subjective:  Breathing slightly improved from yesterday. Walked on unit, required 4L [place].   Objective   Blood pressure (!) 142/80, pulse 97, temperature 98.3 F (36.8 C), temperature source Oral, resp. rate 18, height 5\' 3"  (1.6 m), weight 90.7 kg, SpO2 95 %.        Intake/Output Summary (Last 24 hours) at 11/30/2019 1933 Last data filed at 11/30/2019 1739 Gross per 24 hour  Intake 480 ml  Output 1100 ml  Net -620 ml   Filed Weights   11/25/19 1432  Weight: 90.7 kg    Examination: General: Sitting up in bed in bed, in no acute distress Eyes: EOMI, no icterus Neck: No JVP appreciated sitting upright,  supple Respiratory: Fine bibasilar crackles that clear in mid lung no crackles heard elsewhere posteriorly but fine crackles in bilateral upper lung fields anteriorly appreciated on inspiration, no wheeze Cardiovascular: Regular rate and rhythm, no murmurs Abdomen: Nondistended, bowel sounds present  extremities: no edema Neuro: No weakness, cranial nerves intact Psych: Normal mood, full affect   Resolved Hospital Problem list   N/A  Assessment & Plan:  Acute hypoxemic respiratory failure, infiltrates on CT scan: Symptoms not typical for asthma exacerbations and different than prior asthma exacerbations.  Infiltrates on CT and chest x-ray not consistent with pure asthma exacerbation.  The pattern of infiltrate on CT scan in bilateral upper lobes in the relatively anterior and lateral fashion are most suspicious for noninfectious pneumonitis.  More convincingly, RVP negative.  She has no productive cough no fever.  No identifiable risk factor for hypersensitivity pneumonitis.  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  RA-ILD also considered but this typically is associated with elevated rheumatoid titers (negative in the past) and typically presents in UIP pattern which is not consistent with current imaging.  She also has peripheral eosinophilia which can be seen in the setting of methotrexate lung toxicity.  Other causes of eosinophilia are considered but felt unlikely.  She has sinusitis so EGPA is possible but again felt unlikely.  Admittedly it is a bit puzzling why she has persistent eosinophilia despite systemic prednisone administration.  Discussed possibility of bronchoscopy with patient and admitted it is low yield given she has been on steroids for several days and antibiotics for several days.  After shared decision-making elected not to pursue additional invasive testing and move forward with empiric therapy and  imaging and symptom surveillance. --Prednisone  80 mg daily x 2 weeks, then 60 mg daily x 2 weeks, then 40 mg daily x 4 weeks (further taper per resposne) --sulfa allergy so no bactrim, prescribe dapsone 100 mg daily for PJP ppx if not G6PD deficient (lab ordered) --Discontinue MTX --ANCA, RF, anti-CCP pending-if all negative this argues against vasculitis and RA-ILD --Will arrange outpatient follow up in 4 weeks  Best practice:  Per primary  Labs   Personally reviewed and as per EMR and discussion in this note CBC: Recent Labs  Lab 11/25/19 1414 11/27/19 0543 11/29/19 0611 11/30/19 0544  WBC 5.8 7.5 8.7 9.0  NEUTROABS 4.6 4.3 5.3 5.1  HGB 12.4 11.1* 11.2* 11.6*  HCT 36.8 34.3* 34.2* 35.4*  MCV 104.5* 109.9* 107.2* 106.9*  PLT 248 263 300 305    Basic Metabolic Panel: Recent Labs  Lab 11/25/19 1414 11/26/19 0040 11/27/19 0543 11/28/19 0954 11/29/19 0611 11/30/19 0544  NA 136 137 142  --  138 140  K 3.5 3.7 3.3*  --  3.2* 4.7  CL 105 106 112*  --  108 105  CO2 19* 18* 22  --  22 24  GLUCOSE 227* 216* 113*  --  105* 95  BUN 20 17 19   --  12 13  CREATININE 0.73 0.63 0.61  --  0.68 0.65  CALCIUM 8.8* 8.5* 8.4*  --  8.6* 9.1  MG  --   --  2.2 1.8 2.0  --    GFR: Estimated  Creatinine Clearance: 80.9 mL/min (by C-G formula based on SCr of 0.65 mg/dL). Recent Labs  Lab 11/25/19 1414 11/26/19 0040 11/27/19 0543 11/29/19 0611 11/30/19 0544  PROCALCITON  --  <0.10  --   --   --   WBC 5.8  --  7.5 8.7 9.0    Liver Function Tests: Recent Labs  Lab 11/29/19 0611 11/30/19 0544  AST 40 38  ALT 44 42  ALKPHOS 49 44  BILITOT 0.3 0.4  PROT 6.4* 6.2*  ALBUMIN 3.3* 3.2*   No results for input(s): LIPASE, AMYLASE in the last 168 hours. No results for input(s): AMMONIA in the last 168 hours.  ABG No results found for: PHART, PCO2ART, PO2ART, HCO3, TCO2, ACIDBASEDEF, O2SAT   Coagulation Profile: No results for input(s): INR, PROTIME in the last 168  hours.  Cardiac Enzymes: No results for input(s): CKTOTAL, CKMB, CKMBINDEX, TROPONINI in the last 168 hours.  HbA1C: No results found for: HGBA1C  CBG: No results for input(s): GLUCAP in the last 168 hours.  Review of Systems:   No orthopnea or PND.  No lower extremity swelling.  Has nonproductive cough.  Comprehensive review of systems otherwise negative.  Past Medical History  She,  has a past medical history of Anxiety, Asthma, Cardiac dysrhythmia, Depression, Hypertension, and RA (rheumatoid arthritis) ([facility]).   Surgical History    Past Surgical History:  Procedure Laterality Date  . TOTAL HIP ARTHROPLASTY Right      Social History   reports that she has never smoked. She has never used smokeless tobacco. She reports current alcohol use. She reports that she does not use drugs.   Family History   Her family history includes Breast cancer in her mother; CAD in her father.   Allergies Allergies  Allergen Reactions  . Cephalexin Hives  . Gabapentin Itching and Other (See Comments)    Increase in anxiety and depression   . Naproxen Diarrhea and Nausea And Vomiting  . Pregabalin Other (See Comments)    Increase in anxiety and depression      . Sulphamethoxydiazine Other (See Comments)    Unknown/ Turn red on operating table  . Penicillins Rash    10 years ago     Home Medications  Prior to Admission medications   Medication Sig Start Date End Date Taking? Authorizing Provider  acetaminophen (TYLENOL) 325 MG tablet Take 650 mg by mouth daily as needed for mild pain or headache.  01/26/18  Yes [provider]  albuterol (PROVENTIL) (2.5 MG/3ML) 0.083% nebulizer solution Take 2.5 mg by nebulization daily as needed. 11/24/19  Yes [provider]  buPROPion (WELLBUTRIN XL) 300 MG 24 hr tablet Take 300 mg by mouth daily. 10/15/19  Yes [provider]  diazepam (VALIUM) 5 MG tablet Take 5 mg by mouth every 6 (six) hours as needed for anxiety.    Yes [provider]  EPINEPHrine 0.3 mg/0.3 mL IJ SOAJ injection Inject 0.3 mg into the muscle as needed. 11/29/14  Yes [provider]  etanercept (ENBREL SURECLICK) 50 MG/ML injection Inject 50 mg into the skin once a week. Wednesday 09/24/19  Yes [provider]  fexofenadine (ALLEGRA) 180 MG tablet Take 180 mg by mouth daily.   Yes [provider]  Fluticasone-Salmeterol (ADVAIR DISKUS) 250-50 MCG/DOSE AEPB Inhale 1 puff into the lungs daily. 03/09/19  Yes [provider]  folic acid (FOLVITE) 1 MG tablet Take 1 mg by mouth daily. 11/09/19  Yes [provider]  HYDROcodone-acetaminophen [facility])  10-325 MG tablet Take 1 tablet by mouth every 8 (eight) hours as needed. 11/21/19  Yes [provider]  lisinopril (ZESTRIL) 10 MG tablet Take 10 mg by mouth at bedtime. 11/07/19  Yes [provider]  meloxicam (MOBIC) 15 MG tablet Take 15 mg by mouth daily. 10/19/19  Yes [provider]  methotrexate (RHEUMATREX) 2.5 MG tablet Take 15 mg by mouth once a week. Wednesday 06/05/19  Yes [provider]  montelukast (SINGULAIR) 10 MG tablet Take 10 mg by mouth at bedtime. 10/29/19  Yes [provider]  nystatin (MYCOSTATIN) 100000 UNIT/ML suspension Take 5 mLs by mouth daily as needed (Canker sore).  10/17/19  Yes [provider]  Omega-3 Fatty Acids (FISH OIL) 1000 MG CAPS Take 2,000 Units by mouth at bedtime.   Yes [provider]  polyethylene glycol powder (GLYCOLAX/MIRALAX) 17 GM/SCOOP powder Take 17 g by mouth daily as needed for constipation. 01/26/18  Yes [provider]  pseudoephedrine (SUDAFED) 30 MG tablet Take 30 mg by mouth daily as needed for congestion.    Yes [provider]  sertraline (ZOLOFT) 100 MG tablet Take 100 mg by mouth daily. 06/23/19  Yes [provider]  tiZANidine (ZANAFLEX) 4 MG tablet Take 4 mg by mouth at bedtime. 11/19/19  Yes [provider]

## 2019-12-01 LAB — CULTURE, BLOOD (ROUTINE X 2)
Culture: NO GROWTH
Culture: NO GROWTH
Special Requests: ADEQUATE
Special Requests: ADEQUATE

## 2019-12-01 LAB — COMPREHENSIVE METABOLIC PANEL
ALT: 54 U/L — ABNORMAL HIGH (ref 0–44)
AST: 53 U/L — ABNORMAL HIGH (ref 15–41)
Albumin: 3.3 g/dL — ABNORMAL LOW (ref 3.5–5.0)
Alkaline Phosphatase: 47 U/L (ref 38–126)
Anion gap: 11 (ref 5–15)
BUN: 14 mg/dL (ref 6–20)
CO2: 28 mmol/L (ref 22–32)
Calcium: 9.4 mg/dL (ref 8.9–10.3)
Chloride: 101 mmol/L (ref 98–111)
Creatinine, Ser: 0.63 mg/dL (ref 0.44–1.00)
GFR calc Af Amer: >60 mL/min (ref >60)
GFR calc non Af Amer: >60 mL/min (ref >60)
Glucose, Bld: 103 mg/dL — ABNORMAL HIGH (ref 70–99)
Potassium: 4 mmol/L (ref 3.5–5.1)
Sodium: 140 mmol/L (ref 135–145)
Total Bilirubin: 0.5 mg/dL (ref 0.3–1.2)
Total Protein: 6.3 g/dL — ABNORMAL LOW (ref 6.5–8.1)

## 2019-12-01 LAB — CBC WITH DIFFERENTIAL/PLATELET
Abs Immature Granulocytes: 0.08 10*3/uL — ABNORMAL HIGH (ref 0.00–0.07)
Basophils Absolute: 0.1 10*3/uL (ref 0.0–0.1)
Basophils Relative: 1 %
Eosinophils Absolute: 0.4 10*3/uL (ref 0.0–0.5)
Eosinophils Relative: 4 %
HCT: 34.4 % — ABNORMAL LOW (ref 36.0–46.0)
Hemoglobin: 11 g/dL — ABNORMAL LOW (ref 12.0–15.0)
Immature Granulocytes: 1 %
Lymphocytes Relative: 31 %
Lymphs Abs: 2.7 10*3/uL (ref 0.7–4.0)
MCH: 34.4 pg — ABNORMAL HIGH (ref 26.0–34.0)
MCHC: 32 g/dL (ref 30.0–36.0)
MCV: 107.5 fL — ABNORMAL HIGH (ref 80.0–100.0)
Monocytes Absolute: 1 10*3/uL (ref 0.1–1.0)
Monocytes Relative: 11 %
Neutro Abs: 4.6 10*3/uL (ref 1.7–7.7)
Neutrophils Relative %: 52 %
Platelets: 331 10*3/uL (ref 150–400)
RBC: 3.2 MIL/uL — ABNORMAL LOW (ref 3.87–5.11)
RDW: 12.8 % (ref 11.5–15.5)
WBC: 8.8 10*3/uL (ref 4.0–10.5)
nRBC: 0 % (ref 0.0–0.2)

## 2019-12-01 LAB — RHEUMATOID FACTOR: Rheumatoid fact SerPl-aCnc: 10 IU/mL (ref 0.0–13.9)

## 2019-12-01 LAB — CYCLIC CITRUL PEPTIDE ANTIBODY, IGG/IGA: CCP Antibodies IgG/IgA: 6 units (ref 0–19)

## 2019-12-01 MED ORDER — PREDNISONE 20 MG PO TABS: ORAL_TABLET | ORAL | 0 refills | Status: DC

## 2019-12-01 MED ORDER — IPRATROPIUM-ALBUTEROL 0.5-2.5 (3) MG/3ML IN SOLN: 3.0000 mL | Freq: Three times a day (TID) | RESPIRATORY_TRACT | 1 refills | Status: DC

## 2019-12-01 MED ORDER — BUDESONIDE 0.25 MG/2ML IN SUSP: 0.2500 mg | Freq: Two times a day (BID) | RESPIRATORY_TRACT | 3 refills | Status: DC

## 2019-12-01 MED ORDER — HYDROCODONE-HOMATROPINE 5-1.5 MG/5ML PO SYRP
5.0000 mL | ORAL_SOLUTION | ORAL | 0 refills | Status: AC | PRN
Start: 2019-12-01 — End: 2019-12-16

## 2019-12-01 NOTE — TOC Transition Note (Signed)
Transition of Care [facility]) - CM/SW Discharge Note   Patient Details  Name: Dana Reyes MRN: [id number] Date of Birth: 02-26-1961  Transition of Care [facility]) CM/SW Contact:  Ida Rogue, LCSW Phone Number: 12/01/2019, 3:13 PM   Clinical Narrative:   Patient who is discharging today is in need of home O2, has a ride home.  SAT note and order seen and appreciated.  Called [employer] and asked them to deliver travel tank to room and set up delivery of home concentrator unit.  No further needs identified. TOC sign off.    Final next level of care: Home/Self Care Barriers to Discharge: No Barriers Identified   Patient Goals and CMS Choice        Discharge Placement                       Discharge Plan and Services                                     Social Determinants of Health (SDOH) Interventions     Readmission Risk Interventions No flowsheet data found.

## 2019-12-01 NOTE — Discharge Summary (Addendum)
Physician Discharge Summary  Dana Reyes CHY:[id number] DOB: 1960-05-03 DOA: 11/25/2019  PCP: Jettie Booze, NP  Admit date: 11/25/2019 Discharge date: 12/01/2019  Time spent: 45 minutes  Recommendations for Outpatient Follow-up:  1. Recommend outpatient follow-up with pulmonology in the outpatient setting Dr. Stephannie Peters have placed information on AVS 2. Tapering serial dosing steroids as per pulmonologist-labs G6PD as well as others to be followed in the outpatient setting for autoimmune lung disease by Dr. Silas Flood 3. Refilled various meds including nebulizations-have discontinued some nephrotoxic meds including lisinopril which needs to be reevaluated in the outpatient setting 4. Needs Chem-12 CBC in 1 week 5. Will need 4 L of oxygen on discharge 6. Outpatient weight loss program as per PCP may need close check on her glycemic control in the outpatient setting given high-dose steroids 7. Also slight elevation of LFTs on discharge so please repeat as above  Discharge Diagnoses:  Principal Problem:   Asthma exacerbation Active Problems:   Pneumonia   Metabolic acidosis   Hypertension   Rheumatoid arthritis ([place])   Discharge Condition: Fair  Diet recommendation: Regular  Filed Weights   11/25/19 1432  Weight: 90.7 kg    History of present illness:  59 y.o.asthma diagnosed in 2002 on inhalers and nebulizers, hypertension, rheumatoid arthritis follows at [facility] currently on Enbrel in addition to methotrexate once weekly  Recent Rx asthma exacerbation steroid burst  Came back to emergency room 9/5Denies fevers. States she has been using her home asthma medications including Advair, Singulair, and albuterol inhaler but they are not helping  Sats found to be 84% Rx Solu-Medrol continuous inhalers etc. Covid negative  CT chest negative for PE but consistent with possible pneumonia/pneumonitis versus drug toxicity  Patient was negative for any other viral  upper respiratory infection as well as influenza.    She was started on ceftriaxone, azithromycin and p.o. prednisone.  Her wheezes improved but she still has bilateral rhonchi and is still feels short of breath.  Still on 2 L oxygen.   Hospital Course:  59 y.o.asthma diagnosed in 2002 on inhalers and nebulizers, hypertension, rheumatoid arthritis follows at [facility] currently on Enbrel in addition to methotrexate once weekly  Recent Rx asthma exacerbation steroid burst  Came back to emergency room 9/5Denies fevers. States she has been using her home asthma medications including Advair, Singulair, and albuterol inhaler but they are not helping  Sats found to be 84% Rx Solu-Medrol continuous inhalers etc. Covid negative  CT chest negative for PE but consistent with possible pneumonia/pneumonitis versus drug toxicity  Patient was negative for any other viral upper respiratory infection as well as influenza.    She was started on ceftriaxone, azithromycin and p.o. prednisone.  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 Silas Flood in the outpatient setting and after discussion with him personally he was okay with holding off of dapsone for now until he sees her in the office  Patient discharging on prednisone 80 mg with serial taper over a long period of time as per [facility]  She will be going home on 4 L of oxygen               Normal anion gap metabolic acidosis: Likely due to dehydration.  resolved-CO2 now 22  Mild hypokalemia which is now resolved Stopped all replacement Screening labs as an outpatient  Pulmonary nodule:   CT showing a 3 mm subpleural nodule in the superior segment of the left lower lobe.  -Ensure PCP follow-up for noncontrast chest CT within 12 months  Can also be followed by pulmonology  Essential hypertension: Currently normotensive.  Takes lisinopril at home which has been held. Outpatient reevaluation by primary-would transition to amlodipine if blood pressure is elevated  Rheumatoid arthritis on methotrexate and Enbrel 50  Her CRP and ESR were elevated on admission  She follows with [facility] rheumatology  I have instructed her that she may need further tailoring of her DMARD and would probably not discharge her on methotrexate  BMI 35 Outpatient management  Procedures:  CT chest multiple x-rays  (i.e. Studies not automatically included, echos, thoracentesis, etc; not x-rays)  Consultations:  Pulmonologist Dr. Silas Flood  Discharge Exam: Vitals:   12/01/19 1445 12/01/19 1451  BP:    Pulse:  (!) 128  Resp:    Temp:    SpO2: (!) 87% 90%    General: Awake coherent did not have a good night meds were not on time and she felt short of breath-currently she is much better she is ambulated about 20 feet although she feels tired she thinks that she can manage at home She has no chest pain no fever she does not feel swollen and she has no nausea Her husband  is at the bedside and we went over the plan together Cardiovascular: S1-S2 no murmur slight tachycardia not on telemetry Respiratory: Mild Rales posterolaterally right lung fields decreased air entry on that side as well however left side is clear No wheeze no rhonchi Neck soft supple Trace lower extremity edema ROM intact lower extremities Neurologically intact coherent pleasant  Discharge Instructions   Discharge Instructions    Diet - low sodium heart healthy   Complete by: As directed    Discharge instructions   Complete by: As directed    Continue steroids as indicated and as per tapering dose You will notice several of your blood pressure medications may have changed and we have held off on your Mobic-these are important medications that need to be reevaluated in the outpatient setting with lab work that is done Because we feel you had methotrexate toxicity of the lung, and it is important to talk to your rheumatologist and discuss with him the use of Enbrel As we discussed we will not start dapsone for P JP prophylaxis because this can be reevaluated when you follow-up with Dr. Silas Flood of pulmonology-if there are concerns you can reach out to him directly through MyChart We will order refills on some of your inhalers and you will need close follow-up in the outpatient setting You will also be discharged on oxygen hopefully only temporarily   Increase activity slowly  Complete by: As directed      Allergies as of 12/01/2019      Reactions   Cephalexin Hives   Gabapentin Itching, Other (See Comments)   Increase in anxiety and depression   Naproxen Diarrhea, Nausea And Vomiting   Pregabalin Other (See Comments)   Increase in anxiety and depression   Sulphamethoxydiazine Other (See Comments)   Unknown/ Turn red on operating table   Penicillins Rash   10 years ago      Medication List    STOP taking these medications   fexofenadine 180 MG tablet Commonly known as:  ALLEGRA   lisinopril 10 MG tablet Commonly known as: ZESTRIL   meloxicam 15 MG tablet Commonly known as: MOBIC   methotrexate 2.5 MG tablet Commonly known as: RHEUMATREX   nystatin 100000 UNIT/ML suspension Commonly known as: MYCOSTATIN   pseudoephedrine 30 MG tablet Commonly known as: SUDAFED     TAKE these medications   acetaminophen 325 MG tablet Commonly known as: TYLENOL Take 650 mg by mouth daily as needed for mild pain or headache.   Advair Diskus 250-50 MCG/DOSE Aepb Generic drug: Fluticasone-Salmeterol Inhale 1 puff into the lungs daily.   albuterol (2.5 MG/3ML) 0.083% nebulizer solution Commonly known as: PROVENTIL Take 2.5 mg by nebulization daily as needed.   budesonide 0.25 MG/2ML nebulizer solution Commonly known as: PULMICORT Take 2 mLs (0.25 mg total) by nebulization 2 (two) times daily.   buPROPion 300 MG 24 hr tablet Commonly known as: WELLBUTRIN XL Take 300 mg by mouth daily.   diazepam 5 MG tablet Commonly known as: VALIUM Take 5 mg by mouth every 6 (six) hours as needed for anxiety.   Enbrel SureClick 50 MG/ML injection Generic drug: etanercept Inject 50 mg into the skin once a week. Wednesday   EPINEPHrine 0.3 mg/0.3 mL Soaj injection Commonly known as: EPI-PEN Inject 0.3 mg into the muscle as needed.   Fish Oil 1000 MG Caps Take 2,000 Units by mouth at bedtime.   folic acid 1 MG tablet Commonly known as: FOLVITE Take 1 mg by mouth daily.   HYDROcodone-acetaminophen 10-325 MG tablet Commonly known as: NORCO Take 1 tablet by mouth every 8 (eight) hours as needed.   HYDROcodone-homatropine 5-1.5 MG/5ML syrup Commonly known as: HYCODAN Take 5 mLs by mouth every 4 (four) hours as needed for up to 15 days for cough.   ipratropium-albuterol 0.5-2.5 (3) MG/3ML Soln Commonly known as: DUONEB Take 3 mLs by nebulization 3 (three) times daily.   montelukast 10 MG tablet Commonly known as: SINGULAIR Take 10 mg by mouth at bedtime.    polyethylene glycol powder 17 GM/SCOOP powder Commonly known as: GLYCOLAX/MIRALAX Take 17 g by mouth daily as needed for constipation.   predniSONE 20 MG tablet Commonly known as: DELTASONE Take 4 tablets (80 mg total) by mouth daily with breakfast for 14 days, THEN 3 tablets (60 mg total) daily with breakfast for 14 days, THEN 2 tablets (40 mg total) daily with breakfast for 28 days. Start taking on: December 01, 2019   sertraline 100 MG tablet Commonly known as: ZOLOFT Take 100 mg by mouth daily.   tiZANidine 4 MG tablet Commonly known as: ZANAFLEX Take 4 mg by mouth at bedtime.            Durable Medical Equipment  (From admission, onward)         Start     Ordered   11/30/19 1026  For home use only DME oxygen  Once  Question Answer Comment  Length of Need 6 Months   Mode or (Route) Nasal cannula   Liters per Minute 4   Frequency Continuous (stationary and portable oxygen unit needed)   Oxygen conserving device Yes   Oxygen delivery system Gas      11/30/19 1025         Allergies  Allergen Reactions  . Cephalexin Hives  . Gabapentin Itching and Other (See Comments)    Increase in anxiety and depression   . Naproxen Diarrhea and Nausea And Vomiting  . Pregabalin Other (See Comments)    Increase in anxiety and depression      . Sulphamethoxydiazine Other (See Comments)    Unknown/ Turn red on operating table  . Penicillins Rash    10 years ago      The results of significant diagnostics from this hospitalization (including imaging, microbiology, ancillary and laboratory) are listed below for reference.    Significant Diagnostic Studies: DG Chest 2 View  Result Date: 11/29/2019 CLINICAL DATA:  Cough for 1 week EXAM: CHEST - 2 VIEW COMPARISON:  11/25/2019 FINDINGS: Cardiac shadow is stable. The lungs are well aerated bilaterally. Increased patchy airspace opacities are noted bilaterally these have increased slightly in the interval from the  recent CT examination. No sizable effusion is noted. No bony abnormality is seen. Postsurgical changes in the cervical spine are noted. IMPRESSION: Increase in the degree of interstitial opacities when compare with the prior exam. Electronically Signed   By: Inez Catalina M.D.   On: 11/29/2019 10:06   CT Angio Chest PE W/Cm &/Or Wo Cm  Result Date: 11/25/2019 CLINICAL DATA:  Dyspnea Cough and chest congestion. Negative COVID-19 test yesterday. EXAM: CT ANGIOGRAPHY CHEST WITH CONTRAST TECHNIQUE: Multidetector CT imaging of the chest was performed using the standard protocol during bolus administration of intravenous contrast. Multiplanar CT image reconstructions and MIPs were obtained to evaluate the vascular anatomy. CONTRAST:  59m OMNIPAQUE IOHEXOL 350 MG/ML SOLN COMPARISON:  Portable chest obtained earlier today. FINDINGS: Cardiovascular: Satisfactory opacification of the pulmonary arteries to the segmental level. No evidence of pulmonary embolism. Normal heart size. No pericardial effusion. Mediastinum/Nodes: Mildly enlarged right hilar lymph node with a short axis diameter 12 mm on image number 47 series 6. Normal sized subcarinal and AP window lymph nodes. No enlarged axillary or left hilar lymph nodes. Lungs/Pleura: Patchy and confluent interstitial opacities in both lungs, most pronounced in the periphery of the lungs. 3 mm subpleural nodule in superior segment of the left lower lobe on image number 45 series 7. Minimal bilateral dependent atelectasis.  No pleural fluid. Upper Abdomen: Diffuse low density of the liver relative to the spleen. Musculoskeletal: Mild thoracic spine degenerative changes. Interbody and hardware fusion in the lower cervical spine. Review of the MIP images confirms the above findings. IMPRESSION: 1. No pulmonary emboli. 2. Patchy and confluent interstitial opacities in both lungs, most pronounced in the periphery of the lungs. This can be seen with COVID-19 pneumonitis and  hypersensitivity pneumonitis. Other processes such as influenza pneumonia and organizing pneumonia, as can be seen with drug toxicity and connective tissue disease, can cause a similar imaging pattern. 3. 3 mm subpleural nodule in the superior segment of the left lower lobe. No follow-up needed if patient is low-risk. Non-contrast chest CT can be considered in 12 months if patient is high-risk. This recommendation follows the consensus statement: Guidelines for Management of Incidental Pulmonary Nodules Detected on CT Images: From the Fleischner Society 2017; Radiology 2017; 284:228-243.  4. Mildly enlarged right hilar lymph node, most likely reactive. 5. Diffuse hepatic steatosis. Electronically Signed   By: Claudie Revering M.D.   On: 11/25/2019 16:07   DG Chest Portable 1 View  Result Date: 11/25/2019 CLINICAL DATA:  Shortness of breath. EXAM: PORTABLE CHEST 1 VIEW COMPARISON:  None. FINDINGS: Cardiomediastinal silhouette is normal. Mediastinal contours appear intact. Hazy peribronchial airspace opacities in the left lower thorax. Osseous structures are without acute abnormality. Soft tissues are grossly normal. IMPRESSION: Hazy peribronchial airspace opacities in the left lower thorax may represent bronchitis or early atypical/viral pneumonia. Electronically Signed   By: Fidela Salisbury M.D.   On: 11/25/2019 14:42    Microbiology: Recent Results (from the past 240 hour(s))  SARS Coronavirus 2 by RT PCR (hospital order, performed in [facility] hospital lab) Nasopharyngeal Nasopharyngeal Swab     Status: None   Collection Time: 11/25/19  2:15 PM   Specimen: Nasopharyngeal Swab  Result Value Ref Range Status   SARS Coronavirus 2 NEGATIVE NEGATIVE Final    Comment: (NOTE) SARS-CoV-2 target nucleic acids are NOT DETECTED.  The SARS-CoV-2 RNA is generally detectable in upper and lower respiratory specimens during the acute phase of infection. The lowest concentration of SARS-CoV-2 viral copies this  assay can detect is 250 copies / mL. A negative result does not preclude SARS-CoV-2 infection and should not be used as the sole basis for treatment or other patient management decisions.  A negative result may occur with improper specimen collection / handling, submission of specimen other than nasopharyngeal swab, presence of viral mutation(s) within the areas targeted by this assay, and inadequate number of viral copies (<250 copies / mL). A negative result must be combined with clinical observations, patient history, and epidemiological information.  Fact Sheet for Patients:   StrictlyIdeas.no  Fact Sheet for Healthcare Providers: BankingDealers.co.za  This test is not yet approved or  cleared by the [place] FDA and has been authorized for detection and/or diagnosis of SARS-CoV-2 by FDA under an Emergency Use Authorization (EUA).  This EUA will remain in effect (meaning this test can be used) for the duration of the COVID-19 declaration under Section 564(b)(1) of the Act, 21 U.S.C. section 360bbb-3(b)(1), unless the authorization is terminated or revoked sooner.  Performed at [facility], [address] [place], [place] [address]   Respiratory Panel by PCR     Status: None   Collection Time: 11/25/19 11:00 PM   Specimen: Nasopharyngeal Swab; Respiratory  Result Value Ref Range Status   Adenovirus NOT DETECTED NOT DETECTED Final   Coronavirus 229E NOT DETECTED NOT DETECTED Final    Comment: (NOTE) The Coronavirus on the Respiratory Panel, DOES NOT test for the novel  Coronavirus (2019 nCoV)    Coronavirus HKU1 NOT DETECTED NOT DETECTED Final   Coronavirus NL63 NOT DETECTED NOT DETECTED Final   Coronavirus OC43 NOT DETECTED NOT DETECTED Final   Metapneumovirus NOT DETECTED NOT DETECTED Final   Rhinovirus / Enterovirus NOT DETECTED NOT DETECTED Final   Influenza A NOT DETECTED NOT DETECTED Final   Influenza B  NOT DETECTED NOT DETECTED Final   Parainfluenza Virus 1 NOT DETECTED NOT DETECTED Final   Parainfluenza Virus 2 NOT DETECTED NOT DETECTED Final   Parainfluenza Virus 3 NOT DETECTED NOT DETECTED Final   Parainfluenza Virus 4 NOT DETECTED NOT DETECTED Final   Respiratory Syncytial Virus NOT DETECTED NOT DETECTED Final   Bordetella pertussis NOT DETECTED NOT DETECTED Final   Chlamydophila pneumoniae NOT  DETECTED NOT DETECTED Final   Mycoplasma pneumoniae NOT DETECTED NOT DETECTED Final    Comment: Performed at [facility], [place] [address], [place], [place] [address]  Culture, blood (routine x 2)     Status: None   Collection Time: 11/26/19 12:40 AM   Specimen: BLOOD  Result Value Ref Range Status   Specimen Description   Final    BLOOD RIGHT ANTECUBITAL Performed at [facility] [address] [place], [place] [address]    Special Requests   Final    BOTTLES DRAWN AEROBIC ONLY Blood Culture adequate volume Performed at [facility] [address] [place], [place] [address]    Culture   Final    NO GROWTH 5 DAYS Performed at [facility], [place] [address], [place], [place] [address]    Report Status 12/01/2019 FINAL  Final  Culture, blood (routine x 2)     Status: None   Collection Time: 11/26/19 12:40 AM   Specimen: BLOOD RIGHT FOREARM  Result Value Ref Range Status   Specimen Description   Final    BLOOD RIGHT FOREARM Performed at [facility], [place] [address] [place], [place] [address]    Special Requests   Final    BOTTLES DRAWN AEROBIC ONLY Blood Culture adequate volume Performed at [facility] [address] [place], [place] [address]    Culture   Final    NO GROWTH 5 DAYS Performed at [facility], [place] [address], [place], [place] [address]    Report Status 12/01/2019 FINAL  Final     Labs: Basic Metabolic Panel: Recent Labs  Lab 11/26/19 0040 11/27/19 0543 11/28/19 0954  11/29/19 0611 11/30/19 0544 12/01/19 0618  NA 137 142  --  138 140 140  K 3.7 3.3*  --  3.2* 4.7 4.0  CL 106 112*  --  108 105 101  CO2 18* 22  --  '22 24 28  ' GLUCOSE 216* 113*  --  105* 95 103*  BUN 17 19  --  '12 13 14  ' CREATININE 0.63 0.61  --  0.68 0.65 0.63  CALCIUM 8.5* 8.4*  --  8.6* 9.1 9.4  MG  --  2.2 1.8 2.0  --   --    Liver Function Tests: Recent Labs  Lab 11/29/19 0611 11/30/19 0544 12/01/19 0618  AST 40 38 53*  ALT 44 42 54*  ALKPHOS 49 44 47  BILITOT 0.3 0.4 0.5  PROT 6.4* 6.2* 6.3*  ALBUMIN 3.3* 3.2* 3.3*   No results for input(s): LIPASE, AMYLASE in the last 168 hours. No results for input(s): AMMONIA in the last 168 hours. CBC: Recent Labs  Lab 11/25/19 1414 11/27/19 0543 11/29/19 0611 11/30/19 0544 12/01/19 0618  WBC 5.8 7.5 8.7 9.0 8.8  NEUTROABS 4.6 4.3 5.3 5.1 4.6  HGB 12.4 11.1* 11.2* 11.6* 11.0*  HCT 36.8 34.3* 34.2* 35.4* 34.4*  MCV 104.5* 109.9* 107.2* 106.9* 107.5*  PLT 248 263 300 305 331   Cardiac Enzymes: No results for input(s): CKTOTAL, CKMB, CKMBINDEX, TROPONINI in the last 168 hours. BNP: BNP (last 3 results) No results for input(s): BNP in the last 8760 hours.  ProBNP (last 3 results) No results for input(s): PROBNP in the last 8760 hours.  CBG: No results for input(s): GLUCAP in the last 168 hours.     Signed:  Nita Sells MD   [facility] 12/01/2019, 3:09 PM  Left foot

## 2019-12-02 ENCOUNTER — Telehealth: Payer: Self-pay | Admitting: *Deleted

## 2019-12-02 LAB — GLUCOSE 6 PHOSPHATE DEHYDROGENASE
G6PDH: 10.5 U/g{Hb} (ref 5.5–14.2)
Hemoglobin: 11.4 g/dL (ref 11.1–15.9)

## 2019-12-02 NOTE — Telephone Encounter (Signed)
Called spoke with patient.  Let her know Dr. Laurena Spies recommendations She voiced understanding.  Patient is vaccinated.  She doesn't have a mychart but wanted to sign up so sent her a link through her email to get signed up.  Told her to come in at 2:45 on 12/31/19 for her Chest xray , that her breathing test would be at 3pm and she would see Dr. Judeth Horn at 4pm.   Nothing further needed at this time.

## 2019-12-02 NOTE — Progress Notes (Signed)
Patient seen and examined and no new changes from prior discharge she is doing well breathing better and has no new complaint  Currently awaiting oxygen RN aware-likely can discharge later today  No charge  Pleas Koch, MD [facility] 9:03 AM

## 2019-12-02 NOTE — Telephone Encounter (Signed)
-----   Message from Karren Burly, MD sent at 11/30/2019  7:36 PM EDT ----- Elvina Sidle sorry, not sure who to send f/u request to.  Can this lady be scheduled to see me in 4 weeks with CXR and PFTs same day prior to seeing me? Orders in.  Thanks!

## 2019-12-04 LAB — ANCA TITERS
Atypical P-ANCA titer: <1:20 {titer}
C-ANCA: <1:20 {titer}
P-ANCA: <1:20 {titer}

## 2019-12-05 ENCOUNTER — Other Ambulatory Visit: Payer: Self-pay | Admitting: Pulmonary Disease

## 2019-12-05 ENCOUNTER — Telehealth: Payer: Self-pay | Admitting: Pulmonary Disease

## 2019-12-05 DIAGNOSIS — T380X5A Adverse effect of glucocorticoids and synthetic analogues, initial encounter: Secondary | ICD-10-CM

## 2019-12-05 MED ORDER — DAPSONE 100 MG PO TABS: 100.0000 mg | ORAL_TABLET | Freq: Every day | ORAL | 0 refills | Status: DC

## 2019-12-05 NOTE — Progress Notes (Signed)
Reviewed labs ordered. 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 ANCA negative. No evidence of G6PD deficiency. Called patient. 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 Plan to continue while on prednisone doses > 20 mg daily. She is feeling her breathing is improving. Continuing seroid taper as planned.

## 2019-12-05 NOTE — Telephone Encounter (Signed)
Spoke with the pt  She was seen by Dr Judeth Horn in the hospital and has appt with him pending for 12/31/19 with PFT same day  She is asking for a refill on her hycodan cough syrup  She was prescribed 120 ml on 12/01/19 by Dr Mahala Menghini (hospitalist) and states it only last 4 days b/c she takes 5 ml every 4 hours  Please advise thanks t

## 2019-12-05 NOTE — Progress Notes (Signed)
d 

## 2019-12-06 NOTE — Telephone Encounter (Signed)
Spoke with patient regarding prior message.Advised patient I will send Dr.Hunsucker a message for refill on Hycodan cough syrup.  Dr.Hunsucker can you please advise from previous note from 12/05/19.  Thank you

## 2019-12-06 NOTE — Telephone Encounter (Signed)
Patient calling to let us know she is out cough syrup. Patient phone number is [phone].

## 2019-12-07 NOTE — Telephone Encounter (Signed)
Patient checking on Hycodan cough syrup. Pharmacy is [facility]. Patient phone number is [phone].

## 2019-12-07 NOTE — Telephone Encounter (Signed)
Called and spoke with pt letting her know the info stated by Arlys John and she verbalized understanding. Stated to pt that she could take Delsym or other OTC cough meds until we hear from Dr. Judeth Horn and she verbalized understanding.   Routing to Dr. Judeth Horn for him to advise.

## 2019-12-07 NOTE — Telephone Encounter (Signed)
Spoke with patient regarding prior message. Advised patient I was going to send a new message to the APP of the day .Please see documentation from 12/05/19-12/07/19  Arlys John can you please advise.   Thank you

## 2019-12-07 NOTE — Telephone Encounter (Signed)
12/07/2019  We will not be refilling narcotic cough syrup via a triage message.  Per our office policy this is a message that should be routed to the primary pulmonologist.  And will be addressed when Dr. Judeth Horn is next in the office.  Please route to Dr. Judeth Horn as Lorain Childes.  Since he is seen the patient if he feels comfortable prescribing on narcotic cough syrup without a visit then he can work to coordinate this.  And he can place the order.  [place] PMP aware overdose risk orders 380, patient has 6 prescribers in total over the last 2 years, most recent prescription was hydrocodone cough syrup on 12/01/2019.  She has previous prescriptions for 30-day supplies of hydrocodone tablets that was picked up on 11/21/2019 which was a 30-day supply of 90 tablets.  Patient can utilize Delsym or over-the-counter cough medicine.  Elisha Headland FNP

## 2019-12-11 NOTE — Telephone Encounter (Signed)
Hunsucker, Lesia Sago, MD  You 14 hours ago (5:40 PM)  MH I would advise she request from prescriber that provided it most recently to minimize number of providers she is getting narcotics from. Thanks!   Called and spoke with pt letting her know the info stated by Dr. Judeth Horn and she verbalized understanding. Nothing further needed.

## 2019-12-12 NOTE — Telephone Encounter (Signed)
Dana Reyes, can you please advise on pt email- Dr Judeth Horn not available and pt's appt not until 12/31/19  Dana Reyes sent to [facility] I wanted to touch base with you on a few things:  1. Dapsone: I started taking the Dapsone as prescribed but did not take it last night due to the side effects. The side effects I had were dizziness, headache, blurred vision, joint pains. What do you recommend we do?  2. Cough: my cough continues to be an issue. I understand your direction to go back to the prescribing practitioner for the Hycodan, but the prescriber was the hospitalist and I cannot go back to him for a script. OTC meds are not working. What do you recommend?  3. Mouth sores: I have an increasing number of mouth sours that are starting to create issues eating. What do you recommend?  thanks

## 2019-12-24 NOTE — Telephone Encounter (Signed)
Pt has an OV and PFT on 10/11 with Dr. Judeth Horn. Pt states she is getting blood drawn on 10/7 for another provider and was questioning if any additional labs need to be drawn prior to her visit with Dr. Judeth Horn.   Dr. Judeth Horn, please advise if you would like any labs drawn prior to pt's appt as she is getting labs drawn for another office on 10/7 and would like to avoid two needle sticks if possible. Thanks.

## 2019-12-26 ENCOUNTER — Other Ambulatory Visit: Payer: Self-pay | Admitting: Pulmonary Disease

## 2019-12-26 DIAGNOSIS — M069 Rheumatoid arthritis, unspecified: Secondary | ICD-10-CM

## 2019-12-26 NOTE — Telephone Encounter (Signed)
Dr. Judeth Horn, please see new mychart message from pt: Dana Reyes, Dana Reyes, Dana Sago, MD 3 hours ago (12:48 PM)  EP I am no longer taking the Dapsone.  The cough is persistent and relentless.  The mouth sours are still present and painful.

## 2019-12-27 MED ORDER — FLUCONAZOLE 100 MG PO TABS: ORAL_TABLET | ORAL | 0 refills | Status: DC

## 2019-12-27 NOTE — Telephone Encounter (Signed)
Dr. Hunsucker - please advise. Thanks. 

## 2019-12-31 ENCOUNTER — Encounter: Payer: Self-pay | Admitting: Pulmonary Disease

## 2019-12-31 ENCOUNTER — Ambulatory Visit (INDEPENDENT_AMBULATORY_CARE_PROVIDER_SITE_OTHER): Payer: BC Managed Care – PPO | Admitting: Pulmonary Disease

## 2019-12-31 ENCOUNTER — Other Ambulatory Visit: Payer: Self-pay

## 2019-12-31 VITALS — BP 152/90 | HR 105 | Temp 97.6°F | Ht 63.0 in | Wt 204.0 lb

## 2019-12-31 DIAGNOSIS — J9611 Chronic respiratory failure with hypoxia: Secondary | ICD-10-CM | POA: Diagnosis not present

## 2019-12-31 DIAGNOSIS — J984 Other disorders of lung: Secondary | ICD-10-CM | POA: Diagnosis not present

## 2019-12-31 DIAGNOSIS — T451X5A Adverse effect of antineoplastic and immunosuppressive drugs, initial encounter: Secondary | ICD-10-CM

## 2019-12-31 DIAGNOSIS — R059 Cough, unspecified: Secondary | ICD-10-CM | POA: Diagnosis not present

## 2019-12-31 DIAGNOSIS — T451X1D Poisoning by antineoplastic and immunosuppressive drugs, accidental (unintentional), subsequent encounter: Secondary | ICD-10-CM | POA: Diagnosis not present

## 2019-12-31 LAB — PULMONARY FUNCTION TEST
DL/VA % pred: 102 %
DL/VA: 4.36 ml/min/mmHg/L
DLCO cor % pred: 66 %
DLCO cor: 13.09 ml/min/mmHg
DLCO unc % pred: 66 %
DLCO unc: 13.09 ml/min/mmHg
FEF 25-75 Post: 3.52 L/sec
FEF 25-75 Pre: 2.93 L/sec
FEF2575-%Change-Post: 20 %
FEF2575-%Pred-Post: 150 %
FEF2575-%Pred-Pre: 125 %
FEV1-%Change-Post: 4 %
FEV1-%Pred-Post: 73 %
FEV1-%Pred-Pre: 70 %
FEV1-Post: 1.83 L
FEV1-Pre: 1.76 L
FEV1FVC-%Change-Post: 3 %
FEV1FVC-%Pred-Pre: 112 %
FEV6-%Change-Post: 0 %
FEV6-%Pred-Post: 64 %
FEV6-%Pred-Pre: 64 %
FEV6-Post: 2 L
FEV6-Pre: 2 L
FEV6FVC-%Pred-Post: 103 %
FEV6FVC-%Pred-Pre: 103 %
FVC-%Change-Post: 0 %
FVC-%Pred-Post: 62 %
FVC-%Pred-Pre: 62 %
FVC-Post: 2 L
FVC-Pre: 2 L
Post FEV1/FVC ratio: 91 %
Post FEV6/FVC ratio: 100 %
Pre FEV1/FVC ratio: 88 %
Pre FEV6/FVC Ratio: 100 %
RV % pred: 54 %
RV: 1.04 L
TLC % pred: 65 %
TLC: 3.22 L

## 2019-12-31 MED ORDER — ATOVAQUONE 750 MG/5ML PO SUSP: 1500.0000 mg | Freq: Every day | ORAL | 0 refills | Status: AC

## 2019-12-31 MED ORDER — HYDROCODONE-HOMATROPINE 5-1.5 MG/5ML PO SYRP: 5.0000 mL | ORAL_SOLUTION | Freq: Four times a day (QID) | ORAL | 0 refills | Status: DC | PRN

## 2019-12-31 NOTE — Patient Instructions (Addendum)
Let's get a CT scan and we can discuss next steps based on that.  I am glad you are doing better! I hope the CT shows the same.  I sent a script for that cough syrup that helped before.  Drop prednisone to 40 mg daily. Based on CT we will decide if for 2 weeks or 4 weeks.   Take mepron 1.5 mg suspension daily for PJP prophylaxis.  Come back in 6 weeks for follow up.

## 2019-12-31 NOTE — Telephone Encounter (Signed)
Dr. Judeth Horn, Patient is providing test results from 10/7.  Thank you.

## 2019-12-31 NOTE — Progress Notes (Signed)
PFT done today. 

## 2020-01-01 ENCOUNTER — Other Ambulatory Visit: Payer: Self-pay

## 2020-01-01 ENCOUNTER — Telehealth: Payer: Self-pay

## 2020-01-01 DIAGNOSIS — R059 Cough, unspecified: Secondary | ICD-10-CM

## 2020-01-01 NOTE — Telephone Encounter (Signed)
When patient was leaving her appt on 12/31/19 she asked if she should continue on her nebulizers. After talking to Dr. Judeth Horn he does want her to continue taking these. I called the patient to inform her but she did not answer. I left the patient a voicemail to return my call.

## 2020-01-01 NOTE — Progress Notes (Signed)
Patient ID: Dana Reyes, female    DOB: 07/12/1960, 59 y.o.   MRN: [id number]  Chief Complaint  Patient presents with  . Follow-up    PFT follow-up. SOB is resolving. Dry cough. Denies wheezing.    Referring provider: April Manson, NP  HPI:   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  She was discharged on hospital 4 L nasal cannula with exertion.  Overall, she says her symptoms are improving subjectively in terms of her ability to take deep breaths and sensation of shortness of breath.  She has not very active due to her dyspnea as well as her chronic back pain for which she was planning to have surgery.  In addition to the subjective reports, she reports objective improvement in her oxygenation.  She is now using 2 L nasal cannula.  She notes that at rest it does not drop below the low 90s.  Even with exertion it seems to nadir in the high 80s.  However, she still has significant symptomatic dyspnea and the oxygen at 2 L provides relief of the symptoms.  She notes that her oxygen saturations are staying in the high 90s on 2 L.  She was unable to tolerate dapsone due to changes in vision, joint pains, headache.  She has a sulfa allergy therefore not on Bactrim.  She is on the P JP prophylaxis at this time.  In addition, she describes paroxysmal coughing fits.  Overall these are improved in severity and frequency but still occur from time to time.  Lastly she has mouth sores that were present prior to symptoms that led to her hospitalization.  These improved symptomatically with Magic mouthwash for the coming down.  But may be less severe than prior.  Mainly on the side of her tongue but also occur  underneath her lip inside the mouth.   Questionaires / Pulmonary Flowsheets:   ACT:  No flowsheet data found.  MMRC: No flowsheet data found.  Epworth:  No flowsheet data found.  Tests:   FENO:  No results found for: NITRICOXIDE  PFT: PFT Results Latest Ref Rng & Units 12/31/2019  FVC-Pre L 2.00  FVC-Predicted Pre % 62  FVC-Post L 2.00  FVC-Predicted Post % 62  Pre FEV1/FVC % % 88  Post FEV1/FCV % % 91  FEV1-Pre L 1.76  FEV1-Predicted Pre % 70  FEV1-Post L 1.83  DLCO uncorrected ml/min/mmHg 13.09  DLCO UNC% % 66  DLCO corrected ml/min/mmHg 13.09  DLCO COR %Predicted % 66  DLVA Predicted % 102  TLC L 3.22  TLC % Predicted % 65  RV % Predicted % 54    WALK:  No flowsheet data found.  Imaging: No results found.  Lab Results:  CBC    Component Value Date/Time   WBC 8.8 12/01/2019 0618   RBC 3.20 (L) 12/01/2019 0618   HGB 11.0 (L) 12/01/2019 0618   HGB 11.4 12/01/2019 0618   HCT 34.4 (L) 12/01/2019 0618   PLT 331 12/01/2019 0618   MCV 107.5 (H) 12/01/2019 0618   MCH 34.4 (H) 12/01/2019 0618   MCHC 32.0 12/01/2019 0618   RDW 12.8 12/01/2019 0618   LYMPHSABS 2.7 12/01/2019 0618   MONOABS 1.0 12/01/2019 0618   EOSABS 0.4  12/01/2019 0618   BASOSABS 0.1 12/01/2019 0618    BMET    Component Value Date/Time   NA 140 12/01/2019 0618   K 4.0 12/01/2019 0618   CL 101 12/01/2019 0618   CO2 28 12/01/2019 0618   GLUCOSE 103 (H) 12/01/2019 0618   BUN 14 12/01/2019 0618   CREATININE 0.63 12/01/2019 0618   CALCIUM 9.4 12/01/2019 0618   GFRNONAA >60 12/01/2019 0618   GFRAA >60 12/01/2019 0618    BNP No results found for: BNP  ProBNP No results found for: PROBNP  Specialty Problems      Pulmonary Problems   Asthma exacerbation   Pneumonia      Allergies  Allergen Reactions  . Cephalexin Hives  . Gabapentin Itching and Other (See Comments)    Increase in anxiety and depression   . Naproxen Diarrhea and Nausea And Vomiting  . Pregabalin  Other (See Comments)    Increase in anxiety and depression      . Sulphamethoxydiazine Other (See Comments)    Unknown/ Turn red on operating table  . Penicillins Rash    10 years ago    Immunization History  Administered Date(s) Administered  . Influenza Split 01/06/2015  . Influenza, Seasonal, Injecte, Preservative Fre 01/08/2016  . Moderna SARS-COVID-2 Vaccination 05/31/2019, 07/03/2019  . Pneumococcal Polysaccharide-23 01/09/2018    Past Medical History:  Diagnosis Date  . Anxiety   . Asthma   . Cardiac dysrhythmia   . Depression   . Hypertension   . RA (rheumatoid arthritis) (HCC)     Tobacco History: Social History   Tobacco Use  Smoking Status Never Smoker  Smokeless Tobacco Never Used   Counseling given: Not Answered   Continue to not smoke  Outpatient Encounter Medications as of 12/31/2019  Medication Sig  . acetaminophen (TYLENOL) 325 MG tablet Take 650 mg by mouth daily as needed for mild pain or headache.   . albuterol (PROVENTIL) (2.5 MG/3ML) 0.083% nebulizer solution Take 2.5 mg by nebulization every 6 (six) hours as needed.   . budesonide (PULMICORT) 0.25 MG/2ML nebulizer solution Take 2 mLs (0.25 mg total) by nebulization 2 (two) times daily.  Marland Kitchen buPROPion (WELLBUTRIN XL) 300 MG 24 hr tablet Take 300 mg by mouth daily.  . diazepam (VALIUM) 5 MG tablet Take 5 mg by mouth every 6 (six) hours as needed for anxiety.  Marland Kitchen EPINEPHrine 0.3 mg/0.3 mL IJ SOAJ injection Inject 0.3 mg into the muscle as needed.  . folic acid (FOLVITE) 1 MG tablet Take 1 mg by mouth daily.  Marland Kitchen HYDROcodone-acetaminophen (NORCO) 10-325 MG tablet Take 1 tablet by mouth every 8 (eight) hours as needed.  Marland Kitchen ipratropium-albuterol (DUONEB) 0.5-2.5 (3) MG/3ML SOLN Take 3 mLs by nebulization 3 (three) times daily.  . montelukast (SINGULAIR) 10 MG tablet Take 10 mg by mouth at bedtime.  . Omega-3 Fatty Acids (FISH OIL) 1000 MG CAPS Take 2,000 Units by mouth at bedtime.  . predniSONE  (DELTASONE) 20 MG tablet Take 4 tablets (80 mg total) by mouth daily with breakfast for 14 days, THEN 3 tablets (60 mg total) daily with breakfast for 14 days, THEN 2 tablets (40 mg total) daily with breakfast for 28 days.  Marland Kitchen sertraline (ZOLOFT) 100 MG tablet Take 100 mg by mouth daily.  Marland Kitchen tiZANidine (ZANAFLEX) 4 MG tablet Take 4 mg by mouth at bedtime.  Marland Kitchen atovaquone (MEPRON) 750 MG/5ML suspension Take 10 mLs (1,500 mg total) by mouth daily.  . dapsone 100 MG tablet Take 1  tablet (100 mg total) by mouth daily. (Patient not taking: Reported on 12/31/2019)  . etanercept (ENBREL SURECLICK) 50 MG/ML injection Inject 50 mg into the skin once a week. Wednesday (Patient not taking: Reported on 12/31/2019)  . fluconazole (DIFLUCAN) 100 MG tablet Take 200 mg (2 tab) on day 1 followed by 100 mg (1 tab) daily on days 2-7 (Patient not taking: Reported on 12/31/2019)  . Fluticasone-Salmeterol (ADVAIR DISKUS) 250-50 MCG/DOSE AEPB Inhale 1 puff into the lungs daily. (Patient not taking: Reported on 12/31/2019)  . HYDROcodone-homatropine (HYCODAN) 5-1.5 MG/5ML syrup Take 5 mLs by mouth every 6 (six) hours as needed for cough.  . polyethylene glycol powder (GLYCOLAX/MIRALAX) 17 GM/SCOOP powder Take 17 g by mouth daily as needed for constipation. (Patient not taking: Reported on 12/31/2019)   No facility-administered encounter medications on file as of 12/31/2019.       Physical Exam  BP (!) 152/90 (BP Location: Left Arm, Patient Position: Sitting, Cuff Size: Normal)   Pulse (!) 105   Temp 97.6 F (36.4 C) (Temporal)   Ht 5\' 3"  (1.6 m)   Wt 204 lb (92.5 kg)   SpO2 99%   BMI 36.14 kg/m   Wt Readings from Last 5 Encounters:  12/31/19 204 lb (92.5 kg)  11/25/19 200 lb (90.7 kg)    BMI Readings from Last 5 Encounters:  12/31/19 36.14 kg/m  11/25/19 35.43 kg/m     Physical Exam General: Sitting in wheelchair, wearing oxygen Cardiovascular: Mild tachycardia, regular rhythm, no  murmurs Respiratory: Clear aspiration bilaterally, prior crackles noted in upper lung fields no longer present on my exam, normal work of breathing  Assessment & Plan:   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  Notably, initial images showed what appeared to be developing traction bronchiectasis to do worry that will be some subsequent fibrosis as the lungs recover.  PFTs obtained today demonstrate moderate restriction likely related to this.  DLCO also moderately reduced with mild improvement after adjusted for hemoglobin to still below the lower limit of normal.  Please with her symptomatic improvement in objective improvement in oxygenation.  We will continue with steroid taper, now down to 40 mg daily.  Will obtain repeat CT scan in the coming days to evaluate parenchymal improvement.  If significantly improved in terms of GGO's plan to reduce prednisone dose every 2 weeks.  If not improving or worsening will need to consider surgical lung biopsy no discussed briefly but can be discussed in more detail in the future. Suspect high prednisone dose will be a deterrent to potential back surgery in the near future.  There is no clear pulmonary contraindication to surgery if needed.  Provide formal risk assessment in the future but suspect she will be moderate risk for low risk procedure.  Chronic hypoxemic respiratory failure: Related to above.  This seems to be improving.  Work-up as above.  PJP prophylaxis: Bactrim allergen for what sounds like anaphylaxis in the OR in the past.  Did not tolerate dapsone due to side effects.  Did not have a way to reliably obtain inhaled pentamidine in the area.  New prescription for atovaquone 1.5 g daily sent to pharmacy.  Happy to provide prior authorization if needed as this is the only oral option left.  Asthma: Prior history.  She is to continue her  nebulized therapies.  Would not change anything as where determining response to therapy for presumed methotrexate  lung toxicity as above.  Return in about 6 weeks (around 02/11/2020).   Karren Burly, MD 01/01/2020   This appointment required 50 minutes of patient care (this includes precharting, chart review, review of results, face-to-face care, etc.).

## 2020-01-03 NOTE — Telephone Encounter (Signed)
I sent this script on Monday. Can you make sure she knows it was provided already? Thanks.

## 2020-01-04 MED ORDER — HYDROCODONE-HOMATROPINE 5-1.5 MG/5ML PO SYRP: 5.0000 mL | ORAL_SOLUTION | Freq: Every day | ORAL | 0 refills | Status: DC | PRN

## 2020-01-04 NOTE — Telephone Encounter (Signed)
Spoke with the pt and notified that rx was sent for bedtime dose only  Nothing further needed

## 2020-01-04 NOTE — Telephone Encounter (Signed)
Spoke with the pharmacist at [facility]  He states this would be acceptable to change directions to just take at bedtime  He would not take verbal from me though, so can you please resend new rx with new directions?  Thanks!

## 2020-01-04 NOTE — Telephone Encounter (Signed)
Spoke with the pt and advised her to continue nebs per Dr Judeth Horn  She verbalized understanding  She states having trouble getting her hycodan filled at [facility]  I called [facility] and spoke with pharmacist  The issue is that she is already on maintenance hydrocodone/acetaminophen 10/325 tid  They stated we would need to change antitussive rx to something non-narcotic  Please advise, thanks!

## 2020-01-04 NOTE — Telephone Encounter (Signed)
Called patient back, no answer. Left message to return my call.

## 2020-01-04 NOTE — Telephone Encounter (Signed)
Patient is returning phone call. Patient phone number is [phone].

## 2020-01-08 ENCOUNTER — Telehealth: Payer: Self-pay | Admitting: Pulmonary Disease

## 2020-01-08 MED ORDER — HYDROCODONE-HOMATROPINE 5-1.5 MG/5ML PO SYRP: 5.0000 mL | ORAL_SOLUTION | Freq: Every evening | ORAL | 0 refills | Status: DC | PRN

## 2020-01-08 NOTE — Telephone Encounter (Signed)
Could you send in a new prescription? Thanks!

## 2020-01-08 NOTE — Telephone Encounter (Signed)
Nightly PRN hycodan script sent again per pharmacy request.

## 2020-01-14 ENCOUNTER — Ambulatory Visit (INDEPENDENT_AMBULATORY_CARE_PROVIDER_SITE_OTHER)
Admission: RE | Admit: 2020-01-14 | Discharge: 2020-01-14 | Disposition: A | Discharge status: Home / Self Care | Payer: BC Managed Care – PPO | Source: Ambulatory Visit | Attending: Pulmonary Disease | Admitting: Pulmonary Disease

## 2020-01-14 ENCOUNTER — Other Ambulatory Visit: Payer: Self-pay

## 2020-01-14 DIAGNOSIS — J9611 Chronic respiratory failure with hypoxia: Secondary | ICD-10-CM | POA: Diagnosis not present

## 2020-01-14 DIAGNOSIS — T451X1D Poisoning by antineoplastic and immunosuppressive drugs, accidental (unintentional), subsequent encounter: Secondary | ICD-10-CM

## 2020-01-14 NOTE — Telephone Encounter (Signed)
Dr. Judeth Horn, I know you are out of the office today, please see patient message when you return from [facility] and advise regarding yeast infection.  Please specify dose, frequency, volume and refills of budesonide and will be glad to send to her pharmacy.  Thank you.

## 2020-01-15 MED ORDER — BUDESONIDE 0.25 MG/2ML IN SUSP
0.2500 mg | Freq: Two times a day (BID) | RESPIRATORY_TRACT | 3 refills | Status: DC
Start: 2020-01-15 — End: 2020-08-11

## 2020-01-15 NOTE — Telephone Encounter (Signed)
Dr. Judeth Horn please advise on patient mychart message   thanks.  I will need a refill for the prednisone later this week

## 2020-01-15 NOTE — Telephone Encounter (Signed)
CT high res reviewed. GGOs resolved, mild apical interlobular septal thickening favored to repersent mild scar. Contacted patient and advised continuing every 2 week taper o fprednisone. To decrease to 30 mg x 2 weeks, then 20 mg x 2 weeks, then 15 mg x 2 weeks, then 10 mg x 2 weeks, then 5 mg x 2 weeks then off. She is to contact us with need for refills.

## 2020-01-18 ENCOUNTER — Telehealth: Payer: Self-pay | Admitting: Pulmonary Disease

## 2020-01-18 DIAGNOSIS — T451X5A Adverse effect of antineoplastic and immunosuppressive drugs, initial encounter: Secondary | ICD-10-CM

## 2020-01-18 MED ORDER — PREDNISONE 10 MG PO TABS: ORAL_TABLET | ORAL | 0 refills | Status: AC

## 2020-01-18 NOTE — Telephone Encounter (Signed)
Spoke with the pt and notified of recommendations made by Dr Judeth Horn. She verbalized understanding and denied any questions.

## 2020-01-18 NOTE — Telephone Encounter (Signed)
New script for prednisone taper sent to pharmacy.  Discontinue mepron once she starts the 15 mg daily dose of prednisone.

## 2020-01-18 NOTE — Telephone Encounter (Signed)
ATC patient.  LMTCB. 

## 2020-02-15 ENCOUNTER — Ambulatory Visit (INDEPENDENT_AMBULATORY_CARE_PROVIDER_SITE_OTHER): Payer: BC Managed Care – PPO | Admitting: Pulmonary Disease

## 2020-02-15 ENCOUNTER — Ambulatory Visit (INDEPENDENT_AMBULATORY_CARE_PROVIDER_SITE_OTHER): Payer: BC Managed Care – PPO

## 2020-02-15 ENCOUNTER — Encounter: Payer: Self-pay | Admitting: Pulmonary Disease

## 2020-02-15 ENCOUNTER — Other Ambulatory Visit: Payer: Self-pay

## 2020-02-15 VITALS — BP 124/80 | HR 70 | Temp 97.0°F | Ht 63.0 in | Wt 200.0 lb

## 2020-02-15 DIAGNOSIS — R059 Cough, unspecified: Secondary | ICD-10-CM

## 2020-02-15 DIAGNOSIS — Z92241 Personal history of systemic steroid therapy: Secondary | ICD-10-CM | POA: Diagnosis not present

## 2020-02-15 NOTE — Progress Notes (Signed)
Patient ID: Dana Reyes, female    DOB: 1960/12/10, 59 y.o.   MRN: [id number]  Chief Complaint  Patient presents with  . Follow-up    cough and SOB increased in last 5 days    Referring provider: April Manson, NP  HPI:   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  02/15/20 Returned today. Finishing second week on prednisone 20 mg daily. Tapering every 2 weeks. Last 5 days or so increase DOE, increase cough. Remains dry but more frequent. Not using O2 at rest or at night. Using 1-1.5L with exertion. Sats maybe a bit lower than prior but staying above 88%. Used mepron for a couple weeks but stopped on her own due to taste.  Now again not on PJP ppx.  HPI at initial:  She was discharged on hospital 4 L nasal cannula with exertion.  Overall, she says her symptoms are improving subjectively in terms of her ability to take deep breaths and sensation of shortness of breath.  She has not very active due to her dyspnea as well as her chronic back pain for which she was planning to have surgery.  In addition to the subjective reports, she reports objective improvement in her oxygenation.  She is now using 2 L nasal cannula.  She notes that at rest it does not drop below the low 90s.  Even with exertion it seems to nadir in the high 80s.  However, she still has significant symptomatic dyspnea and the oxygen at 2 L provides relief of the symptoms.  She notes that her oxygen saturations are staying in the high 90s on 2 L.  She was unable to tolerate dapsone due to changes in vision, joint pains, headache.  She has a sulfa allergy therefore not on Bactrim.  She is on the P JP prophylaxis at this time.  In addition, she  describes paroxysmal coughing fits.  Overall these are improved in severity and frequency but still occur from time to time.  Lastly she has mouth sores that were present prior to symptoms that led to her hospitalization.  These improved symptomatically with Magic mouthwash for the coming down.  But may be less severe than prior.  Mainly on the side of her tongue but also occur underneath her lip inside the mouth.   Questionaires / Pulmonary Flowsheets:   ACT:  No flowsheet data found.  MMRC: No flowsheet data found.  Epworth:  No flowsheet data found.  Tests:   FENO:  No results found for: NITRICOXIDE  PFT: PFT Results Latest Ref Rng & Units 12/31/2019  FVC-Pre L 2.00  FVC-Predicted Pre % 62  FVC-Post L 2.00  FVC-Predicted Post % 62  Pre FEV1/FVC % % 88  Post FEV1/FCV % % 91  FEV1-Pre L 1.76  FEV1-Predicted Pre % 70  FEV1-Post L 1.83  DLCO uncorrected ml/min/mmHg 13.09  DLCO UNC% % 66  DLCO corrected ml/min/mmHg 13.09  DLCO COR %Predicted % 66  DLVA Predicted % 102  TLC L 3.22  TLC % Predicted % 65  RV % Predicted % 54    WALK:  No flowsheet data found.  Imaging: No results found.  Lab Results:  CBC    Component Value Date/Time   WBC 8.8  12/01/2019 0618   RBC 3.20 (L) 12/01/2019 0618   HGB 11.0 (L) 12/01/2019 0618   HGB 11.4 12/01/2019 0618   HCT 34.4 (L) 12/01/2019 0618   PLT 331 12/01/2019 0618   MCV 107.5 (H) 12/01/2019 0618   MCH 34.4 (H) 12/01/2019 0618   MCHC 32.0 12/01/2019 0618   RDW 12.8 12/01/2019 0618   LYMPHSABS 2.7 12/01/2019 0618   MONOABS 1.0 12/01/2019 0618   EOSABS 0.4 12/01/2019 0618   BASOSABS 0.1 12/01/2019 0618    BMET    Component Value Date/Time   NA 140 12/01/2019 0618   K 4.0 12/01/2019 0618   CL 101 12/01/2019 0618   CO2 28 12/01/2019 0618   GLUCOSE 103 (H) 12/01/2019 0618   BUN 14 12/01/2019 0618   CREATININE 0.63 12/01/2019 0618   CALCIUM 9.4 12/01/2019 0618   GFRNONAA >60 12/01/2019 0618   GFRAA >60  12/01/2019 0618    BNP No results found for: BNP  ProBNP No results found for: PROBNP  Specialty Problems      Pulmonary Problems   Asthma exacerbation   Pneumonia      Allergies  Allergen Reactions  . Cephalexin Hives  . Gabapentin Itching and Other (See Comments)    Increase in anxiety and depression   . Naproxen Diarrhea and Nausea And Vomiting  . Pregabalin Other (See Comments)    Increase in anxiety and depression      . Sulphamethoxydiazine Other (See Comments)    Unknown/ Turn red on operating table  . Penicillins Rash    10 years ago    Immunization History  Administered Date(s) Administered  . Influenza Split 01/06/2015  . Influenza, Seasonal, Injecte, Preservative Fre 01/08/2016  . Moderna SARS-COVID-2 Vaccination 05/31/2019, 07/03/2019  . Pneumococcal Polysaccharide-23 01/09/2018    Past Medical History:  Diagnosis Date  . Anxiety   . Asthma   . Cardiac dysrhythmia   . Depression   . Hypertension   . RA (rheumatoid arthritis) (HCC)     Tobacco History: Social History   Tobacco Use  Smoking Status Never Smoker  Smokeless Tobacco Never Used   Counseling given: Not Answered   Continue to not smoke  Outpatient Encounter Medications as of 02/15/2020  Medication Sig  . acetaminophen (TYLENOL) 325 MG tablet Take 650 mg by mouth daily as needed for mild pain or headache.   . albuterol (PROVENTIL) (2.5 MG/3ML) 0.083% nebulizer solution Take 2.5 mg by nebulization every 6 (six) hours as needed.   . budesonide (PULMICORT) 0.25 MG/2ML nebulizer solution Take 2 mLs (0.25 mg total) by nebulization 2 (two) times daily.  Marland Kitchen buPROPion (WELLBUTRIN XL) 300 MG 24 hr tablet Take 300 mg by mouth daily.  . diazepam (VALIUM) 5 MG tablet Take 5 mg by mouth every 6 (six) hours as needed for anxiety.  Marland Kitchen EPINEPHrine 0.3 mg/0.3 mL IJ SOAJ injection Inject 0.3 mg into the muscle as needed.  . etanercept (ENBREL SURECLICK) 50 MG/ML injection Inject 50 mg into the  skin once a week. Wednesday  . fluconazole (DIFLUCAN) 100 MG tablet Take 200 mg (2 tab) on day 1 followed by 100 mg (1 tab) daily on days 2-7  . HYDROcodone-acetaminophen (NORCO) 10-325 MG tablet Take 1 tablet by mouth every 8 (eight) hours as needed.  Marland Kitchen HYDROcodone-homatropine (HYCODAN) 5-1.5 MG/5ML syrup Take 5 mLs by mouth at bedtime as needed for cough.  Marland Kitchen ipratropium-albuterol (DUONEB) 0.5-2.5 (3) MG/3ML SOLN Take 3 mLs by nebulization 3 (three) times daily.  . montelukast (  SINGULAIR) 10 MG tablet Take 10 mg by mouth at bedtime.  . Omega-3 Fatty Acids (FISH OIL) 1000 MG CAPS Take 2,000 Units by mouth at bedtime.  . predniSONE (DELTASONE) 10 MG tablet Take 3 tablets (30 mg total) by mouth daily with breakfast for 14 days, THEN 2 tablets (20 mg total) daily with breakfast for 14 days, THEN 1.5 tablets (15 mg total) daily with breakfast for 14 days, THEN 1 tablet (10 mg total) daily with breakfast for 14 days, THEN 0.5 tablets (5 mg total) daily with breakfast for 14 days.  Marland Kitchen sertraline (ZOLOFT) 100 MG tablet Take 100 mg by mouth daily.  Marland Kitchen tiZANidine (ZANAFLEX) 4 MG tablet Take 4 mg by mouth at bedtime.  Marland Kitchen atovaquone (MEPRON) 750 MG/5ML suspension Take 10 mLs (1,500 mg total) by mouth daily. (Patient not taking: Reported on 02/15/2020)  . dapsone 100 MG tablet Take 1 tablet (100 mg total) by mouth daily. (Patient not taking: Reported on 02/15/2020)  . Fluticasone-Salmeterol (ADVAIR DISKUS) 250-50 MCG/DOSE AEPB Inhale 1 puff into the lungs daily.  (Patient not taking: Reported on 02/15/2020)  . folic acid (FOLVITE) 1 MG tablet Take 1 mg by mouth daily. (Patient not taking: Reported on 02/15/2020)  . polyethylene glycol powder (GLYCOLAX/MIRALAX) 17 GM/SCOOP powder Take 17 g by mouth daily as needed for constipation.  (Patient not taking: Reported on 02/15/2020)   No facility-administered encounter medications on file as of 02/15/2020.       Physical Exam  BP 124/80 (BP Location: Left Arm,  Cuff Size: Normal)   Pulse 70   Temp (!) 97 F (36.1 C)   Ht 5\' 3"  (1.6 m)   Wt 200 lb (90.7 kg)   SpO2 98%   BMI 35.43 kg/m   Wt Readings from Last 5 Encounters:  02/15/20 200 lb (90.7 kg)  12/31/19 204 lb (92.5 kg)  11/25/19 200 lb (90.7 kg)    BMI Readings from Last 5 Encounters:  02/15/20 35.43 kg/m  12/31/19 36.14 kg/m  11/25/19 35.43 kg/m     Physical Exam General: Sitting in wheelchair, wearing oxygen Cardiovascular: Mild tachycardia, regular rhythm, no murmurs Respiratory: Clear to auscultation bilaterally, normal work of breathing  Assessment & Plan:   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 PFTs obtained 12/2019 demonstrate moderate restriction likely related to this.  DLCO also moderately reduced with mild improvement after adjusted for hemoglobin to still below the lower limit of normal.  Symptoms slowly were improving. Tapering prednisone every 2 weeks, currently 20 mg daily. Worsened cough and DOE last few days. Possible etiologies include PJP with steroid taper given lack of adherence to ppx, recrudescence of inflammation from original inflammatory process MTX, other infection, or atelectasis/deconditioning.  --CXR today, --CBC diff, BMP, LDH, fungitell --Hold prednisone at 20 mg, may need to increase back to 30 based on images, consider cross sectional imaging, bronchoscopy if CXR worrisome   Chronic hypoxemic respiratory failure: Related to above.  This seems to stable to mildly imrpoving.  Work-up as above.  PJP prophylaxis: Bactrim allergen for what sounds like anaphylaxis in the OR in the past.  Did not tolerate dapsone due to side effects. Took mepron for a coupe of weeks and then stopped due to taste. Stressed importance of this medication  Asthma: Prior history.  She is to continue her nebulized therapies.  Would not change anything as where  determining response to therapy for presumed methotrexate lung  toxicity as above.  Return in about 6 weeks (around 03/28/2020).   Karren Burly, MD 02/15/2020   This appointment required 35 minutes of patient care (this includes precharting, chart review, review of results, face-to-face care, etc.).

## 2020-02-15 NOTE — Addendum Note (Signed)
Addended by: Demetrio Lapping E on: 02/15/2020 11:44 AM   Modules accepted: Orders

## 2020-02-15 NOTE — Patient Instructions (Addendum)
Keep prednisone at 20 mg  Chest xray today  Labs today to screen for infection  I will let you know results Monday. Labs may take a bit longer.  Come back to clinic in 6 weeks for follow up

## 2020-02-16 LAB — CBC WITH DIFFERENTIAL/PLATELET
Absolute Monocytes: 817 cells/uL (ref 200–950)
Basophils Absolute: 94 cells/uL (ref 0–200)
Basophils Relative: 0.7 %
Eosinophils Absolute: 174 cells/uL (ref 15–500)
Eosinophils Relative: 1.3 %
HCT: 43.2 % (ref 35.0–45.0)
Hemoglobin: 14.3 g/dL (ref 11.7–15.5)
Lymphs Abs: 4127 cells/uL — ABNORMAL HIGH (ref 850–3900)
MCH: 31 pg (ref 27.0–33.0)
MCHC: 33.1 g/dL (ref 32.0–36.0)
MCV: 93.7 fL (ref 80.0–100.0)
MPV: 10.6 fL (ref 7.5–12.5)
Monocytes Relative: 6.1 %
Neutro Abs: 8187 cells/uL — ABNORMAL HIGH (ref 1500–7800)
Neutrophils Relative %: 61.1 %
Platelets: 354 10*3/uL (ref 140–400)
RBC: 4.61 10*6/uL (ref 3.80–5.10)
RDW: 13.6 % (ref 11.0–15.0)
Total Lymphocyte: 30.8 %
WBC: 13.4 10*3/uL — ABNORMAL HIGH (ref 3.8–10.8)

## 2020-02-16 LAB — BASIC METABOLIC PANEL
BUN: 20 mg/dL (ref 7–25)
CO2: 25 mmol/L (ref 20–32)
Calcium: 10.4 mg/dL (ref 8.6–10.4)
Chloride: 99 mmol/L (ref 98–110)
Creat: 0.97 mg/dL (ref 0.50–1.05)
Glucose, Bld: 114 mg/dL — ABNORMAL HIGH (ref 65–99)
Potassium: 4.3 mmol/L (ref 3.5–5.3)
Sodium: 137 mmol/L (ref 135–146)

## 2020-02-16 LAB — LACTATE DEHYDROGENASE: LDH: 255 U/L — ABNORMAL HIGH (ref 120–250)

## 2020-02-18 NOTE — Progress Notes (Signed)
Labs and imaging reassuring. CXR looks clear, radiologist agrees. Advise decrease prednisone to 15 mg daily x 2 weeks and continue prior planned taper. Please call us if oxygen or SOB is not getting better or if gets worse.

## 2020-02-19 NOTE — Progress Notes (Signed)
Called and spoke with patient, advised patient of results and recommendations per Dr. Judeth Horn.  She verbalized understanding.  She stated that when she is wearing her oxygen at 2L and she exerts herself, her sats drop to 89-90%.  She states the cough is still bad, sometimes it sounds deep and other times it sounds high pitched.  She says her sob is better than it was a couple of weeks ago, but feels like her breathing is going in the wrong direction.  She is wondering whether she has an infection based on her WBC level.  She would like a refill of her Hycodan cough syrup.  Dr. Judeth Horn, Please advise.

## 2020-02-20 LAB — FUNGITELL, SERUM: Fungitell Result: <31 pg/mL (ref <80)

## 2020-02-22 NOTE — Addendum Note (Signed)
Addended byVilma Meckel on: 02/22/2020 02:34 PM   Modules accepted: Orders

## 2020-02-22 NOTE — Progress Notes (Signed)
Called and spoke with patient, advised on results/recommendations from Dr. Judeth Horn.  Lab tech is already gone for the day.  Advised the patient to come by on Monday to have lab work drawn.  She stated she does not drive and her husband is not available, she will talk with neighbors and see if someone can bring her by on Monday.  Will make Dr. Judeth Horn aware that labs will not be drawn until at least Monday.

## 2020-02-22 NOTE — Progress Notes (Signed)
The CXR shows no sign of infection. Worry this could be sign of return of prior inflammatory condition. Or given relative immobility pulmonary embolus is possible. Can she come get a D-Dimer today? I will order.

## 2020-02-25 ENCOUNTER — Other Ambulatory Visit (INDEPENDENT_AMBULATORY_CARE_PROVIDER_SITE_OTHER): Payer: BC Managed Care – PPO

## 2020-02-25 DIAGNOSIS — M069 Rheumatoid arthritis, unspecified: Secondary | ICD-10-CM | POA: Diagnosis not present

## 2020-02-25 DIAGNOSIS — Z92241 Personal history of systemic steroid therapy: Secondary | ICD-10-CM

## 2020-02-25 DIAGNOSIS — R059 Cough, unspecified: Secondary | ICD-10-CM

## 2020-02-25 LAB — CBC WITH DIFFERENTIAL/PLATELET
Basophils Absolute: 0.1 10*3/uL (ref 0.0–0.1)
Basophils Relative: 0.5 % (ref 0.0–3.0)
Eosinophils Absolute: 0.1 10*3/uL (ref 0.0–0.7)
Eosinophils Relative: 0.6 % (ref 0.0–5.0)
HCT: 40.1 % (ref 36.0–46.0)
Hemoglobin: 13.5 g/dL (ref 12.0–15.0)
Lymphocytes Relative: 22.3 % (ref 12.0–46.0)
Lymphs Abs: 2.6 10*3/uL (ref 0.7–4.0)
MCHC: 33.7 g/dL (ref 30.0–36.0)
MCV: 92.7 fl (ref 78.0–100.0)
Monocytes Absolute: 0.7 10*3/uL (ref 0.1–1.0)
Monocytes Relative: 6 % (ref 3.0–12.0)
Neutro Abs: 8.4 10*3/uL — ABNORMAL HIGH (ref 1.4–7.7)
Neutrophils Relative %: 70.6 % (ref 43.0–77.0)
Platelets: 323 10*3/uL (ref 150.0–400.0)
RBC: 4.33 Mil/uL (ref 3.87–5.11)
RDW: 15.2 % (ref 11.5–15.5)
WBC: 11.9 10*3/uL — ABNORMAL HIGH (ref 4.0–10.5)

## 2020-02-25 LAB — D-DIMER, QUANTITATIVE: D-Dimer, Quant: 0.89 mcg/mL FEU — ABNORMAL HIGH (ref <0.50)

## 2020-02-25 MED ORDER — IPRATROPIUM-ALBUTEROL 0.5-2.5 (3) MG/3ML IN SOLN: 3.0000 mL | Freq: Three times a day (TID) | RESPIRATORY_TRACT | 11 refills | Status: DC

## 2020-02-25 NOTE — Telephone Encounter (Signed)
mychart message sent by pt requesting a refill of her hydromet cough syrup as well as her ipratropium-albuterol neb sol. I have taken care of refilling her neb sol.  Dr. Judeth Horn, please advise if you are okay refilling her hydromet cough syrup.

## 2020-02-25 NOTE — Addendum Note (Signed)
Addended by: Demetrio Lapping E on: 02/25/2020 10:22 AM   Modules accepted: Orders

## 2020-02-26 ENCOUNTER — Other Ambulatory Visit: Payer: Self-pay | Admitting: Pulmonary Disease

## 2020-02-26 ENCOUNTER — Other Ambulatory Visit: Payer: Self-pay

## 2020-02-26 ENCOUNTER — Telehealth: Payer: Self-pay | Admitting: Internal Medicine

## 2020-02-26 ENCOUNTER — Encounter (HOSPITAL_BASED_OUTPATIENT_CLINIC_OR_DEPARTMENT_OTHER): Payer: Self-pay

## 2020-02-26 ENCOUNTER — Ambulatory Visit (HOSPITAL_BASED_OUTPATIENT_CLINIC_OR_DEPARTMENT_OTHER)
Admission: RE | Admit: 2020-02-26 | Discharge: 2020-02-26 | Disposition: A | Discharge status: Home / Self Care | Payer: BC Managed Care – PPO | Source: Ambulatory Visit | Attending: Pulmonary Disease | Admitting: Pulmonary Disease

## 2020-02-26 DIAGNOSIS — R0789 Other chest pain: Secondary | ICD-10-CM

## 2020-02-26 MED ORDER — IOHEXOL 350 MG/ML SOLN
100.0000 mL | Freq: Once | INTRAVENOUS | Status: AC | PRN
  Administered 2020-02-26: 100 mL via INTRAVENOUS

## 2020-02-26 MED ORDER — HYDROCODONE-HOMATROPINE 5-1.5 MG/5ML PO SYRP
5.0000 mL | ORAL_SOLUTION | Freq: Every evening | ORAL | 0 refills | Status: DC | PRN
Start: 2020-02-26 — End: 2020-03-03

## 2020-02-26 NOTE — Telephone Encounter (Signed)
Ok to let her go home

## 2020-02-26 NOTE — Progress Notes (Signed)
Ongoing DOE. Treated with steroids for presumed MTX toxicity. Interval chest imaging clear. D-Dimer ordered and mildly elevated. CTA Chest PE protocol ordered to evaluate for pulmonary embolus. If negative, will pursue TTE.

## 2020-02-26 NOTE — Telephone Encounter (Signed)
No callback number left. Called general number at [facility] imaging, was advised that patient did not have procedure there. Pt was at [facility] [place].  Called imaging center at [place] [facility] to let pt go.  Nothing further needed at this time- will close encounter.

## 2020-02-26 NOTE — Telephone Encounter (Signed)
Refill ordered

## 2020-02-26 NOTE — Progress Notes (Signed)
D-Dimer is mildly elevated. I have ordered a CTA Chest protocol to evaluate for pulmonary embolus, needs to be scheduled today or tomorrow - put in comments. I will refill cough syrup

## 2020-02-26 NOTE — Telephone Encounter (Signed)
Received call report from [facility] on patient's CT  done on today . MW please review the result/impression copied below:  IMPRESSION: 1. No definite evidence of pulmonary embolus. 2. Hepatic steatosis.  Patient still at Imaging place may she be sent home  Please advise, thank you.

## 2020-02-26 NOTE — Progress Notes (Signed)
Scheduled for today.

## 2020-02-28 NOTE — Progress Notes (Signed)
CT shows no sign of blood clot, pneumonia, fluid overload or heart failure, or signs of previous shadows in the lungs. This is good - lungs look clear.

## 2020-02-29 NOTE — Progress Notes (Signed)
Called and spoke with patient, advised of results and recommendations per Dr. Judeth Horn.  She verbalized understanding.  Nothing further needed.

## 2020-02-29 NOTE — Telephone Encounter (Signed)
Dr. Judeth Horn please advise on patient mychart message  The cough med you sent to [facility] is on back order.  The pharmacist reported that they have sent you 3 messages asking if you want to order something else and they have not heard from you.  Please contact [facility] in [facility] to discuss an alternative drug.  thanks

## 2020-03-03 ENCOUNTER — Other Ambulatory Visit: Payer: Self-pay | Admitting: Pulmonary Disease

## 2020-03-03 MED ORDER — HYDROCODONE-HOMATROPINE 5-1.5 MG PO TABS
1.0000 | ORAL_TABLET | Freq: Every evening | ORAL | 0 refills | Status: DC | PRN
Start: 2020-03-03 — End: 2020-05-01

## 2020-03-03 NOTE — Telephone Encounter (Signed)
Dr. Judeth Horn patient sent email    My guess is that they tried to call Dr. Heriberto Antigua rather than you.  I keep telling them to call you.  I've been trying the OTC cough meds and they do not work to give me relief.    Last week, I inadvertently missed a couple of days of nebs.  Then I realized I was feeling better without them.  So, I stopped using them for a few more days.  I still get SOB with activity but only need the O2 periodically.  Aside from the cough, things are looking up.  Please advise

## 2020-04-07 ENCOUNTER — Ambulatory Visit: Payer: BC Managed Care – PPO | Admitting: Pulmonary Disease

## 2020-04-15 ENCOUNTER — Other Ambulatory Visit: Payer: Self-pay

## 2020-04-15 ENCOUNTER — Encounter: Payer: Self-pay | Admitting: Pulmonary Disease

## 2020-04-15 ENCOUNTER — Ambulatory Visit (INDEPENDENT_AMBULATORY_CARE_PROVIDER_SITE_OTHER): Payer: BC Managed Care – PPO | Admitting: Pulmonary Disease

## 2020-04-15 VITALS — BP 138/82 | HR 101 | Temp 97.0°F | Ht 63.0 in | Wt 213.4 lb

## 2020-04-15 DIAGNOSIS — J9611 Chronic respiratory failure with hypoxia: Secondary | ICD-10-CM | POA: Diagnosis not present

## 2020-04-15 DIAGNOSIS — T451X1S Poisoning by antineoplastic and immunosuppressive drugs, accidental (unintentional), sequela: Secondary | ICD-10-CM

## 2020-04-15 DIAGNOSIS — R059 Cough, unspecified: Secondary | ICD-10-CM | POA: Diagnosis not present

## 2020-04-15 DIAGNOSIS — J4530 Mild persistent asthma, uncomplicated: Secondary | ICD-10-CM

## 2020-04-15 DIAGNOSIS — R0600 Dyspnea, unspecified: Secondary | ICD-10-CM

## 2020-04-15 DIAGNOSIS — J9612 Chronic respiratory failure with hypercapnia: Secondary | ICD-10-CM

## 2020-04-15 DIAGNOSIS — R0609 Other forms of dyspnea: Secondary | ICD-10-CM

## 2020-04-15 MED ORDER — BUDESONIDE-FORMOTEROL FUMARATE 160-4.5 MCG/ACT IN AERO: 2.0000 | INHALATION_SPRAY | Freq: Two times a day (BID) | RESPIRATORY_TRACT | 12 refills | Status: DC

## 2020-04-15 NOTE — Patient Instructions (Signed)
Nice to see you again  I am sorry about the cough coming back, sounds like we had a good couple of weeks.  It is possible that this is a manifestation of your asthma is acting up as you come off steroids.  I think is less likely related to what the steroids were treating.  Use Symbicort 2 puffs twice a day to treat presumed cough related to asthma  I sent a referral to the dermatologist for your skin rash, you may increase your Allegra to twice a day.  Continue Benadryl as needed.  Please see me a message in a couple weeks to let me know how the cough is doing on the Symbicort.  We may need to consider resuming low-dose prednisone to help ease the cough away.  Come back for follow-up with Dana Reyes in 2 months or sooner as needed.

## 2020-04-16 NOTE — Progress Notes (Signed)
Patient ID: Dana Reyes, female    DOB: 1961/02/07, 60 y.o.   MRN: [id number]  Chief Complaint  Patient presents with  . Follow-up    Dry cough x 2 weeks, SOB, wheezing in the morning     Referring provider: April Manson, NP  HPI:   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  Overall has continue to make progress. Using less O2, none at rest. Gets winded with minimal exertion and oxygen helps her recover. Using about 3 times a day briefly. Asked if sats drop and she says yes to 88%. Cough was essentially totally resolved. Has come back last couple of months. Dry, non-productive. Notes came back around time or shortly after finishing prednisone therapy. Notes she has not used nebulized ICS/LABA in weeks for underlying asthma as she felt that was making cough worse in past.   02/15/20 Returned today. Finishing second week on prednisone 20 mg daily. Tapering every 2 weeks. Last 5 days or so increase DOE, increase cough. Remains dry but more frequent. Not using O2 at rest or at night. Using 1-1.5L with exertion. Sats maybe a bit lower than prior but staying above 88%. Used mepron for a couple weeks but stopped on her own due to taste.  Now again not on PJP ppx.  HPI at initial:  She was discharged on hospital 4 L nasal cannula with exertion.  Overall, she says her symptoms are improving subjectively in terms of her ability to take deep breaths and sensation of shortness of breath.  She has not very active due to her dyspnea as well as her chronic back pain for which she was planning to have surgery.  In addition to the subjective reports, she reports objective improvement in her oxygenation.  She is now using 2 L nasal  cannula.  She notes that at rest it does not drop below the low 90s.  Even with exertion it seems to nadir in the high 80s.  However, she still has significant symptomatic dyspnea and the oxygen at 2 L provides relief of the symptoms.  She notes that her oxygen saturations are staying in the high 90s on 2 L.  She was unable to tolerate dapsone due to changes in vision, joint pains, headache.  She has a sulfa allergy therefore not on Bactrim.  She is on the P JP prophylaxis at this time.  In addition, she describes paroxysmal coughing fits.  Overall these are improved in severity and frequency but still occur from time to time.  Lastly she has mouth sores that were present prior to symptoms that led to her hospitalization.  These improved symptomatically with Magic mouthwash for the coming down.  But may be less severe than prior.  Mainly on the side of her tongue but also occur underneath her lip inside the mouth.   Questionaires / Pulmonary Flowsheets:   ACT:  No flowsheet data found.  MMRC: No flowsheet data found.  Epworth:  No flowsheet data found.  Tests:   FENO:  No results found for: NITRICOXIDE  PFT: PFT Results Latest Ref Rng & Units 12/31/2019  FVC-Pre L 2.00  FVC-Predicted Pre % 62  FVC-Post L 2.00  FVC-Predicted Post % 62  Pre FEV1/FVC % % 88  Post FEV1/FCV % % 91  FEV1-Pre L 1.76  FEV1-Predicted Pre % 70  FEV1-Post L 1.83  DLCO uncorrected ml/min/mmHg 13.09  DLCO UNC% % 66  DLCO corrected ml/min/mmHg 13.09  DLCO COR %Predicted % 66  DLVA Predicted % 102  TLC L 3.22  TLC % Predicted % 65  RV % Predicted % 54    WALK:  No flowsheet data found.  Imaging: No results found.  Lab Results:  CBC    Component Value Date/Time   WBC 11.9 (H) 02/25/2020 1022   RBC 4.33 02/25/2020 1022   HGB 13.5 02/25/2020 1022   HGB 11.4 12/01/2019 0618   HCT 40.1 02/25/2020 1022   PLT 323.0 02/25/2020 1022   MCV 92.7 02/25/2020 1022   MCH 31.0 02/15/2020 1144   MCHC  33.7 02/25/2020 1022   RDW 15.2 02/25/2020 1022   LYMPHSABS 2.6 02/25/2020 1022   MONOABS 0.7 02/25/2020 1022   EOSABS 0.1 02/25/2020 1022   BASOSABS 0.1 02/25/2020 1022    BMET    Component Value Date/Time   NA 137 02/15/2020 1144   K 4.3 02/15/2020 1144   CL 99 02/15/2020 1144   CO2 25 02/15/2020 1144   GLUCOSE 114 (H) 02/15/2020 1144   BUN 20 02/15/2020 1144   CREATININE 0.97 02/15/2020 1144   CALCIUM 10.4 02/15/2020 1144   GFRNONAA >60 12/01/2019 0618   GFRAA >60 12/01/2019 0618    BNP No results found for: BNP  ProBNP No results found for: PROBNP  Specialty Problems      Pulmonary Problems   Asthma exacerbation   Pneumonia      Allergies  Allergen Reactions  . Cephalexin Hives  . Gabapentin Itching and Other (See Comments)    Increase in anxiety and depression   . Naproxen Diarrhea and Nausea And Vomiting  . Pregabalin Other (See Comments)    Increase in anxiety and depression      . Sulphamethoxydiazine Other (See Comments)    Unknown/ Turn red on operating table  . Penicillins Rash    10 years ago    Immunization History  Administered Date(s) Administered  . Influenza Split 01/06/2015  . Influenza, Seasonal, Injecte, Preservative Fre 01/08/2016  . Influenza,inj,Quad PF,6+ Mos 01/09/2018, 02/26/2019  . Moderna Sars-Covid-2 Vaccination 05/31/2019, 07/03/2019  . Pneumococcal Polysaccharide-23 01/09/2018    Past Medical History:  Diagnosis Date  . Anxiety   . Asthma   . Cardiac dysrhythmia   . Depression   . Hypertension   . RA (rheumatoid arthritis) (HCC)     Tobacco History: Social History   Tobacco Use  Smoking Status Never Smoker  Smokeless Tobacco Never Used   Counseling given: Not Answered   Continue to not smoke  Outpatient Encounter Medications as of 04/15/2020  Medication Sig  . acetaminophen (TYLENOL) 325 MG tablet Take 650 mg by mouth daily as needed for mild pain or headache.   . budesonide-formoterol (SYMBICORT)  160-4.5 MCG/ACT inhaler Inhale 2 puffs into the lungs in the morning and at bedtime.  Marland Kitchen buPROPion (WELLBUTRIN XL) 300 MG 24 hr tablet Take 300 mg by mouth daily.  . diazepam (VALIUM) 5 MG tablet Take 5 mg by mouth every 6 (six) hours as needed for anxiety.  Marland Kitchen EPINEPHrine 0.3 mg/0.3 mL IJ SOAJ injection Inject 0.3 mg into the muscle as needed.  Marland Kitchen HYDROcodone-acetaminophen (NORCO) 10-325 MG tablet Take 1 tablet by mouth every 8 (eight) hours as needed.  Marland Kitchen HYDROcodone-Homatropine 5-1.5 MG TABS Take 1 tablet by mouth  at bedtime as needed (cough).  . montelukast (SINGULAIR) 10 MG tablet Take 10 mg by mouth at bedtime.  . Omega-3 Fatty Acids (FISH OIL) 1000 MG CAPS Take 2,000 Units by mouth at bedtime.  . sertraline (ZOLOFT) 100 MG tablet Take 100 mg by mouth daily.  Marland Kitchen tiZANidine (ZANAFLEX) 4 MG tablet Take 4 mg by mouth at bedtime.  Marland Kitchen albuterol (PROVENTIL) (2.5 MG/3ML) 0.083% nebulizer solution Take 2.5 mg by nebulization every 6 (six) hours as needed.  (Patient not taking: Reported on 04/15/2020)  . budesonide (PULMICORT) 0.25 MG/2ML nebulizer solution Take 2 mLs (0.25 mg total) by nebulization 2 (two) times daily. (Patient not taking: Reported on 04/15/2020)  . dapsone 100 MG tablet Take 1 tablet (100 mg total) by mouth daily. (Patient not taking: Reported on 04/15/2020)  . etanercept (ENBREL SURECLICK) 50 MG/ML injection Inject 50 mg into the skin once a week. Wednesday (Patient not taking: Reported on 04/15/2020)  . fluconazole (DIFLUCAN) 100 MG tablet Take 200 mg (2 tab) on day 1 followed by 100 mg (1 tab) daily on days 2-7 (Patient not taking: Reported on 04/15/2020)  . Fluticasone-Salmeterol (ADVAIR) 250-50 MCG/DOSE AEPB Inhale 1 puff into the lungs daily.  (Patient not taking: Reported on 04/15/2020)  . folic acid (FOLVITE) 1 MG tablet Take 1 mg by mouth daily. (Patient not taking: Reported on 04/15/2020)  . ipratropium-albuterol (DUONEB) 0.5-2.5 (3) MG/3ML SOLN Take 3 mLs by nebulization 3 (three)  times daily. (Patient not taking: Reported on 04/15/2020)  . polyethylene glycol powder (GLYCOLAX/MIRALAX) 17 GM/SCOOP powder Take 17 g by mouth daily as needed for constipation.  (Patient not taking: Reported on 04/15/2020)   No facility-administered encounter medications on file as of 04/15/2020.       Physical Exam  BP 138/82 (BP Location: Right Arm, Cuff Size: Normal)   Pulse (!) 101   Temp (!) 97 F (36.1 C)   Ht 5\' 3"  (1.6 m)   Wt 213 lb 6.4 oz (96.8 kg)   SpO2 97%   BMI 37.80 kg/m   Wt Readings from Last 5 Encounters:  04/15/20 213 lb 6.4 oz (96.8 kg)  02/15/20 200 lb (90.7 kg)  12/31/19 204 lb (92.5 kg)  11/25/19 200 lb (90.7 kg)    BMI Readings from Last 5 Encounters:  04/15/20 37.80 kg/m  02/15/20 35.43 kg/m  12/31/19 36.14 kg/m  11/25/19 35.43 kg/m     Physical Exam General: Sitting in wheelchair, wearing oxygen Cardiovascular: Mild tachycardia, regular rhythm, no murmurs Respiratory: Clear to auscultation bilaterally, normal work of breathing,  Good air movement  Assessment & Plan:   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 PFTs obtained 12/2019 demonstrate moderate restriction likely related to this.  DLCO also moderately reduced with mild improvement after adjusted for hemoglobin to still below the lower limit of normal.  Symptoms of hypoxemia have slowly improved over the course of many weeks.  She has had intermittent setbacks which have been further investigated with improved chest imaging on chest x-ray and CT, no evidence of PE, no evidence of opportunistic infections on high-dose prednisone via blood test as well as imaging (she was not adherent to PJP prophylaxis).  She is down to using oxygen around 3 times a day day when she is symptomatic, notes her oxygen dropped to about 88% during these times.   Chronic hypoxemic respiratory failure:  Improving to nearly resolved.  Related to above.  --Recommend  Pulm rehab, she thinks she can not complete due to back pain, awaiting surgery  Cough: Nonproductive.  Has been a major issue throughout course of treatment.  She has some significant primary cough until about 2 weeks ago at time of finishing her last taper of prednisone 5 mg daily.  Since then she had a nagging unproductive cough. Given improvement in hypoxemia, infiltrates, think less likely related to MTX toxicity. Fear related to asthma. She has not been using nebs for asthma. Will resume ICS/LABA via high dose Symbicort. If cough not improving will have to entertain resuming low dose 5mg  daily prednisone with slower taper  Asthma: Prior history.  Resume ISCS/LABA to see if helps cough. Eos have been elevated on steroid therapy, consider biologics.  Return in about 2 months (around 06/13/2020).   Karren Burly, MD 04/16/2020   This appointment required 42 minutes of patient care (this includes precharting, chart review, review of results, face-to-face care, etc.).

## 2020-04-24 DIAGNOSIS — L299 Pruritus, unspecified: Secondary | ICD-10-CM

## 2020-04-24 NOTE — Telephone Encounter (Signed)
Dr. Judeth Horn, please see mychart message sent by pt and advise.  Upon further reviewing recent orders, I do not see where referral to dermatology had been placed after pt's last OV.

## 2020-04-29 NOTE — Telephone Encounter (Signed)
Please see message from patient  OK - I'll try the prednisone.  I have 10 mg tabs that I can break in 1/2 for the 5mg  doses but will need a script for the 1mg  pills.  Same routine as last time -  2 weeks 5 mg, 2 weeks 4mg , 2 weeks 3mg  and so on?  Should I stop the Symbicort?  The cough is a little more productive than it has ever been.  Not much - but some.  Can you also send in a script for the cough syrup?  thanks

## 2020-05-01 MED ORDER — PREDNISONE 1 MG PO TABS: ORAL_TABLET | ORAL | 0 refills | Status: DC

## 2020-05-01 MED ORDER — HYDROCODONE-HOMATROPINE 5-1.5 MG/5ML PO SYRP: 5.0000 mL | ORAL_SOLUTION | Freq: Every evening | ORAL | 0 refills | Status: AC | PRN

## 2020-05-01 NOTE — Telephone Encounter (Signed)
I sent a new prescription for the 1 mg tabs earlier today. Please let me know if the pharmacy does not contact you to pick the new pills up.   If the symbicort is not helping, then it is ok to stop. I think it is likely we will need to use an inhaler again in the future unless for the asthma, but it is ok to stop for now.

## 2020-05-01 NOTE — Telephone Encounter (Signed)
Ongoing cough. Was better on steroids for presumed MTX lung toxicity. Tried resuming asthma therapy with ICS/LABA hgih dose. No improvement. Will try low dose long prednisone taper 5 mg x 1 week taper by 1 mg weekly until off. Cough returned after previous 5 mg dose.   Refill for nightly PRN hydrocodone cough syrup sent.

## 2020-05-01 NOTE — Telephone Encounter (Signed)
Patient sent email :  Thanks for your message.  There are still a couple issues that were addressed. I need a script for the 1 mg prednisone pills and, should I stop the  Symbicort   Dr. Judeth Horn please advise.

## 2020-08-06 DIAGNOSIS — R609 Edema, unspecified: Secondary | ICD-10-CM

## 2020-08-06 NOTE — Telephone Encounter (Signed)
Next available f/u, myself or APP. Also referral to cardiology please.

## 2020-08-06 NOTE — Telephone Encounter (Signed)
Please advise on patient mychart message  At the end .  of my 04/15/2020 visit, you requested that I return in 2 months.  The folks at the checkout desk said they would be in contact to schedule the next appt but they have never contacted me.  Until recently, I was making slow but steady progress.  But, in the last 3 weeks my shortness of breath has worsened significantly.  After a short walk from out  great room to the bathroom, my PO2 falls to the mid 80s without O2.  What is also of concern to me is that I have developed significant bilateral swelling in my legs, ankles and feet with swelling also in my abdomen.  There are no signs of a DVT and I was briefly seen by a PA who does not feel it is a DVT.  Please let me know if an appt with you is appropriate or if you think I should see a cardiologist.

## 2020-08-11 ENCOUNTER — Other Ambulatory Visit: Payer: Self-pay

## 2020-08-11 ENCOUNTER — Ambulatory Visit (INDEPENDENT_AMBULATORY_CARE_PROVIDER_SITE_OTHER): Payer: BC Managed Care – PPO | Admitting: Pulmonary Disease

## 2020-08-11 ENCOUNTER — Encounter: Payer: Self-pay | Admitting: Pulmonary Disease

## 2020-08-11 ENCOUNTER — Ambulatory Visit (INDEPENDENT_AMBULATORY_CARE_PROVIDER_SITE_OTHER): Payer: BC Managed Care – PPO

## 2020-08-11 VITALS — BP 122/82 | HR 97 | Temp 98.0°F | Ht 63.0 in | Wt 229.6 lb

## 2020-08-11 DIAGNOSIS — R0902 Hypoxemia: Secondary | ICD-10-CM | POA: Diagnosis not present

## 2020-08-11 DIAGNOSIS — R0609 Other forms of dyspnea: Secondary | ICD-10-CM

## 2020-08-11 DIAGNOSIS — R0600 Dyspnea, unspecified: Secondary | ICD-10-CM | POA: Diagnosis not present

## 2020-08-11 DIAGNOSIS — M7989 Other specified soft tissue disorders: Secondary | ICD-10-CM

## 2020-08-11 MED ORDER — FUROSEMIDE 20 MG PO TABS: 20.0000 mg | ORAL_TABLET | Freq: Every day | ORAL | 1 refills | Status: DC

## 2020-08-11 NOTE — Progress Notes (Signed)
Patient ID: Dana Reyes, female    DOB: 27-Jun-1960, 60 y.o.   MRN: [id number]  Chief Complaint  Patient presents with  . Follow-up    O2 dropping with activities, using 2L of O2 and holds at 97%. Dry cough with some chest tightness in center of chest.    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 Was improving symptomatically and from hypoxemia standpoint but with recent worsening last few weeks with return to regular O2 use in setting of LE swelling and weight gain. Here for further evaluation   At last visit had residual cough, dyspnea seem to be improving.  Slow taper of prednisone starting at 5 mg down by 1 every week, this improved the cough.  Overall she experienced mild improvement in hypoxemia using oxygen infrequently over the last few weeks.  Still significant and severe dyspnea on exertion or shortness of breath.  Not very active largely due to low back pain but certainly limited by her dyspnea.  However the course the last few 4 weeks or so her shortness of breath and dyspnea on exertion worsen.  She describes gradual increase in lower extremity edema all the way to her abdomen.  She has significant weight gain as well over this time course.  It appears her weight is up about 16 pound since last visit.  Weight was up 13 pounds in January from prior visit in the fall.  Overall about 30 pound weight gain over the last few months.  She had a first this was due to the prednisone but since has been on prednisone for many weeks and the weight Is coming as well in the setting of swelling she is concerned.  She contacted [place] over the phone.  Referral to cardiology was placed.   Sounds like first available is July via [facility].  Inquiring with [facility] about a sooner appointment.  HPI at initial:  She was discharged on hospital 4 L nasal cannula with exertion.  Overall, she says her symptoms are improving subjectively in terms of her ability to take deep breaths and sensation of shortness of breath.  She has not very active due to her dyspnea as well as her chronic back pain for which she was planning to have surgery.  In addition to the subjective reports, she reports objective improvement in her oxygenation.  She is now using 2 L nasal cannula.  She notes that at rest it does not drop below the low 90s.  Even with exertion it seems to nadir in the high 80s.  However, she still has significant symptomatic dyspnea and the oxygen at 2 L provides relief of the symptoms.  She notes that her oxygen saturations are staying in the high 90s on 2 L.  She was unable to tolerate dapsone due to changes in vision, joint pains, headache.  She has a sulfa allergy therefore not on Bactrim.  She is on the P JP prophylaxis at this time.  In addition, she describes paroxysmal coughing fits.  Overall these are improved in severity and frequency but still occur from time to time.  Lastly she has mouth sores that were present prior to symptoms that led to her hospitalization.  These  improved symptomatically with Magic mouthwash for the coming down.  But may be less severe than prior.  Mainly on the side of her tongue but also occur underneath her lip inside the mouth.   Questionaires / Pulmonary Flowsheets:   ACT:  No flowsheet data found.  MMRC: No flowsheet data found.  Epworth:  No flowsheet data found.  Tests:   FENO:  No results found for: NITRICOXIDE  PFT: PFT Results Latest Ref Rng & Units 12/31/2019  FVC-Pre L 2.00  FVC-Predicted Pre % 62  FVC-Post L 2.00  FVC-Predicted Post % 62  Pre FEV1/FVC % % 88  Post FEV1/FCV % % 91  FEV1-Pre L 1.76  FEV1-Predicted Pre % 70   FEV1-Post L 1.83  DLCO uncorrected ml/min/mmHg 13.09  DLCO UNC% % 66  DLCO corrected ml/min/mmHg 13.09  DLCO COR %Predicted % 66  DLVA Predicted % 102  TLC L 3.22  TLC % Predicted % 65  RV % Predicted % 54    WALK:  No flowsheet data found.  Imaging: No results found.  Lab Results:  CBC    Component Value Date/Time   WBC 11.9 (H) 02/25/2020 1022   RBC 4.33 02/25/2020 1022   HGB 13.5 02/25/2020 1022   HGB 11.4 12/01/2019 0618   HCT 40.1 02/25/2020 1022   PLT 323.0 02/25/2020 1022   MCV 92.7 02/25/2020 1022   MCH 31.0 02/15/2020 1144   MCHC 33.7 02/25/2020 1022   RDW 15.2 02/25/2020 1022   LYMPHSABS 2.6 02/25/2020 1022   MONOABS 0.7 02/25/2020 1022   EOSABS 0.1 02/25/2020 1022   BASOSABS 0.1 02/25/2020 1022    BMET    Component Value Date/Time   NA 137 02/15/2020 1144   K 4.3 02/15/2020 1144   CL 99 02/15/2020 1144   CO2 25 02/15/2020 1144   GLUCOSE 114 (H) 02/15/2020 1144   BUN 20 02/15/2020 1144   CREATININE 0.97 02/15/2020 1144   CALCIUM 10.4 02/15/2020 1144   GFRNONAA >60 12/01/2019 0618   GFRAA >60 12/01/2019 0618    BNP No results found for: BNP  ProBNP No results found for: PROBNP  Specialty Problems      Pulmonary Problems   Asthma exacerbation   Pneumonia      Allergies  Allergen Reactions  . Cephalexin Hives  . Gabapentin Itching and Other (See Comments)    Increase in anxiety and depression   . Methotrexate Derivatives Other (See Comments)    Shortness of breath. ILD  . Naproxen Diarrhea and Nausea And Vomiting  . Pregabalin Other (See Comments)    Increase in anxiety and depression      . Sulphamethoxydiazine Other (See Comments)    Unknown/ Turn red on operating table  . Penicillins Rash    10 years ago    Immunization History  Administered Date(s) Administered  . Influenza Split 01/06/2015  . Influenza, Seasonal, Injecte, Preservative Fre 01/08/2016  . Influenza,inj,Quad PF,6+ Mos 01/09/2018, 02/26/2019  .  Moderna Sars-Covid-2 Vaccination 05/31/2019, 07/03/2019  . Pneumococcal Polysaccharide-23 01/09/2018    Past Medical History:  Diagnosis Date  . Anxiety   . Asthma   . Cardiac dysrhythmia   . Depression   . Hypertension   . RA (rheumatoid arthritis) (HCC)     Tobacco History: Social History   Tobacco Use  Smoking Status Never Smoker  Smokeless Tobacco Never Used   Counseling given: Yes   Continue to not smoke  Outpatient Encounter Medications as of 08/11/2020  Medication Sig  .  furosemide (LASIX) 20 MG tablet Take 1 tablet (20 mg total) by mouth daily.  Marland Kitchen Upadacitinib ER (RINVOQ) 15 MG TB24   . acetaminophen (TYLENOL) 325 MG tablet Take 650 mg by mouth daily as needed for mild pain or headache.   Marland Kitchen buPROPion (WELLBUTRIN XL) 300 MG 24 hr tablet Take 300 mg by mouth daily.  . diazepam (VALIUM) 5 MG tablet Take 5 mg by mouth every 6 (six) hours as needed for anxiety.  Marland Kitchen EPINEPHrine 0.3 mg/0.3 mL IJ SOAJ injection Inject 0.3 mg into the muscle as needed.  Marland Kitchen HYDROcodone-acetaminophen (NORCO) 10-325 MG tablet Take 1 tablet by mouth every 8 (eight) hours as needed.  Marland Kitchen HYDROcodone-homatropine (HYCODAN) 5-1.5 MG/5ML syrup Take 5 mLs by mouth at bedtime as needed for cough.  . montelukast (SINGULAIR) 10 MG tablet Take 10 mg by mouth at bedtime.  . Omega-3 Fatty Acids (FISH OIL) 1000 MG CAPS Take 2,000 Units by mouth at bedtime.  . sertraline (ZOLOFT) 100 MG tablet Take 100 mg by mouth daily.  Marland Kitchen tiZANidine (ZANAFLEX) 4 MG tablet Take 4 mg by mouth at bedtime.  . [DISCONTINUED] albuterol (PROVENTIL) (2.5 MG/3ML) 0.083% nebulizer solution Take 2.5 mg by nebulization every 6 (six) hours as needed.  (Patient not taking: Reported on 04/15/2020)  . [DISCONTINUED] budesonide (PULMICORT) 0.25 MG/2ML nebulizer solution Take 2 mLs (0.25 mg total) by nebulization 2 (two) times daily. (Patient not taking: Reported on 04/15/2020)  . [DISCONTINUED] budesonide-formoterol (SYMBICORT) 160-4.5 MCG/ACT  inhaler Inhale 2 puffs into the lungs in the morning and at bedtime.  . [DISCONTINUED] dapsone 100 MG tablet Take 1 tablet (100 mg total) by mouth daily. (Patient not taking: Reported on 04/15/2020)  . [DISCONTINUED] etanercept (ENBREL SURECLICK) 50 MG/ML injection Inject 50 mg into the skin once a week. Wednesday (Patient not taking: Reported on 04/15/2020)  . [DISCONTINUED] fluconazole (DIFLUCAN) 100 MG tablet Take 200 mg (2 tab) on day 1 followed by 100 mg (1 tab) daily on days 2-7 (Patient not taking: Reported on 04/15/2020)  . [DISCONTINUED] Fluticasone-Salmeterol (ADVAIR) 250-50 MCG/DOSE AEPB Inhale 1 puff into the lungs daily.  (Patient not taking: Reported on 04/15/2020)  . [DISCONTINUED] folic acid (FOLVITE) 1 MG tablet Take 1 mg by mouth daily. (Patient not taking: Reported on 04/15/2020)  . [DISCONTINUED] ipratropium-albuterol (DUONEB) 0.5-2.5 (3) MG/3ML SOLN Take 3 mLs by nebulization 3 (three) times daily. (Patient not taking: Reported on 04/15/2020)  . [DISCONTINUED] polyethylene glycol powder (GLYCOLAX/MIRALAX) 17 GM/SCOOP powder Take 17 g by mouth daily as needed for constipation.  (Patient not taking: No sig reported)  . [DISCONTINUED] predniSONE (DELTASONE) 1 MG tablet 4 mg daily x 1 week, 3 mg daily x 1 week, 2 mg daily x 1 week, 1 mg daily x 1 week.   No facility-administered encounter medications on file as of 08/11/2020.       Physical Exam  BP 122/82 (BP Location: Left Arm, Cuff Size: Normal)   Pulse 97   Temp 98 F (36.7 C) (Oral)   Ht  (1.6 m)   Wt 229 lb 9.6 oz (104.1 kg)   SpO2 99%   BMI 40.67 kg/m   Wt Readings from Last 5 Encounters:  08/11/20 229 lb 9.6 oz (104.1 kg)  04/15/20 213 lb 6.4 oz (96.8 kg)  02/15/20 200 lb (90.7 kg)  12/31/19 204 lb (92.5 kg)  11/25/19 200 lb (90.7 kg)    BMI Readings from Last 5 Encounters:  08/11/20 40.67 kg/m  04/15/20 37.80 kg/m  02/15/20 35.43  kg/m  12/31/19 36.14 kg/m  11/25/19 35.43 kg/m     Physical  Exam General: Sitting in wheelchair, wearing oxygen Cardiovascular: Mild tachycardia, regular rhythm, no murmurs Respiratory: Crackles in bilateral bases, otherwise clear to auscultation bilaterally, normal work of breathing,  Good air movement Extremities: Pitting edema 1 to +2 at least knee and she reports higher (wearing pants today)  Assessment & Plan:   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 PFTs obtained 12/2019 demonstrate moderate restriction likely related to this.  DLCO also moderately reduced with mild improvement after adjusted for hemoglobin to still below the lower limit of normal.  Symptoms of hypoxemia have slowly improved over the course of months.  She has had intermittent setbacks which have been further investigated with improved chest imaging on chest x-ray and CT, no evidence of PE, no evidence of opportunistic infections on high-dose prednisone via blood test as well as imaging (she was not adherent to PJP prophylaxis). Able to come off O2 some. Now breathing worse in setting of LE swelling. Unlikely to be related to prior process.  Chronic hypoxemic respiratory failure: Improving to nearly resolved in prior weeks.  Worsened with volume overload. --CXR today --Lasix 20 mg daily --TTE --Referral to [facility] scheduled 09/2020, awaiting appt in June via [facility] as she wishes to be seen sooner  Timing of follow up pending results of TTE   Karren Burly, MD 08/11/2020   This appointment required 45 minutes of patient care (this includes precharting, chart review, review of results, face-to-face care, etc.).

## 2020-08-11 NOTE — Patient Instructions (Addendum)
Take lasix 20 mg once daily - to help with swelling and hopefully breathing  We will get a chest xray today  I ordered a heart ultrasound - I hope by the end of the week  Follow up determined by heart ultrasound results

## 2020-08-21 ENCOUNTER — Other Ambulatory Visit: Payer: Self-pay

## 2020-08-21 ENCOUNTER — Ambulatory Visit (HOSPITAL_COMMUNITY): Payer: BC Managed Care – PPO | Attending: Cardiovascular Disease

## 2020-08-21 DIAGNOSIS — R0902 Hypoxemia: Secondary | ICD-10-CM | POA: Diagnosis present

## 2020-08-21 LAB — ECHOCARDIOGRAM COMPLETE
Area-P 1/2: 2.95 cm2
S' Lateral: 2.7 cm

## 2020-09-03 ENCOUNTER — Other Ambulatory Visit: Payer: Self-pay | Admitting: Pulmonary Disease

## 2020-09-03 DIAGNOSIS — M7989 Other specified soft tissue disorders: Secondary | ICD-10-CM

## 2020-09-03 DIAGNOSIS — R0902 Hypoxemia: Secondary | ICD-10-CM

## 2020-09-03 DIAGNOSIS — R0609 Other forms of dyspnea: Secondary | ICD-10-CM

## 2020-09-04 NOTE — Telephone Encounter (Signed)
Dr. Judeth Horn, please advise if you are okay with [place] refilling med or if this should be deferred to either PCP.

## 2020-09-05 NOTE — Telephone Encounter (Signed)
Yes I am ok with refill. Is it helping?

## 2020-10-01 ENCOUNTER — Other Ambulatory Visit: Payer: Self-pay

## 2020-10-01 DIAGNOSIS — R0902 Hypoxemia: Secondary | ICD-10-CM

## 2020-10-01 DIAGNOSIS — M7989 Other specified soft tissue disorders: Secondary | ICD-10-CM

## 2020-10-01 DIAGNOSIS — R0609 Other forms of dyspnea: Secondary | ICD-10-CM

## 2020-10-01 DIAGNOSIS — R0600 Dyspnea, unspecified: Secondary | ICD-10-CM

## 2020-10-01 MED ORDER — FUROSEMIDE 20 MG PO TABS: 20.0000 mg | ORAL_TABLET | Freq: Every day | ORAL | 1 refills | Status: DC

## 2020-10-30 ENCOUNTER — Other Ambulatory Visit: Payer: Self-pay

## 2020-10-30 DIAGNOSIS — R0609 Other forms of dyspnea: Secondary | ICD-10-CM

## 2020-10-30 DIAGNOSIS — M7989 Other specified soft tissue disorders: Secondary | ICD-10-CM

## 2020-10-30 DIAGNOSIS — R0600 Dyspnea, unspecified: Secondary | ICD-10-CM

## 2020-10-30 DIAGNOSIS — R0902 Hypoxemia: Secondary | ICD-10-CM

## 2020-10-30 MED ORDER — FUROSEMIDE 20 MG PO TABS: 20.0000 mg | ORAL_TABLET | Freq: Every day | ORAL | 3 refills | Status: DC

## 2021-01-29 ENCOUNTER — Other Ambulatory Visit: Payer: Self-pay

## 2021-01-29 DIAGNOSIS — R0609 Other forms of dyspnea: Secondary | ICD-10-CM

## 2021-01-29 DIAGNOSIS — M7989 Other specified soft tissue disorders: Secondary | ICD-10-CM

## 2021-01-29 DIAGNOSIS — R0902 Hypoxemia: Secondary | ICD-10-CM

## 2021-01-29 MED ORDER — FUROSEMIDE 20 MG PO TABS: 20.0000 mg | ORAL_TABLET | Freq: Every day | ORAL | 3 refills | Status: AC

## 2021-01-31 IMAGING — CT CT CHEST HIGH RESOLUTION W/O CM
2 of 7 series · 14 of 36 positions shown, 17 images · non-contrast
Comparison: 11/25/2019 chest CT angiogram.

CLINICAL DATA: Worsening dyspnea on exertion. Rheumatoid arthritis
on prednisone. History of methotrexate.

EXAM:
CT CHEST WITHOUT CONTRAST
TECHNIQUE: Multidetector CT imaging of the chest was performed following the
standard protocol without intravenous contrast. High resolution
imaging of the lungs, as well as inspiratory and expiratory imaging,
was performed.

[Series 6: high resolution · axial · 0.73mm/px · z∈[+1180,+1404]mm · 11 of 269 slices shown, 14 images]
[im 23/269  mediastinal]
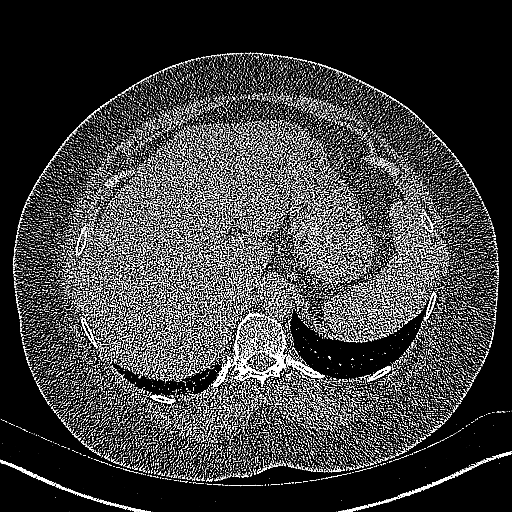
[im 23/269  lung]
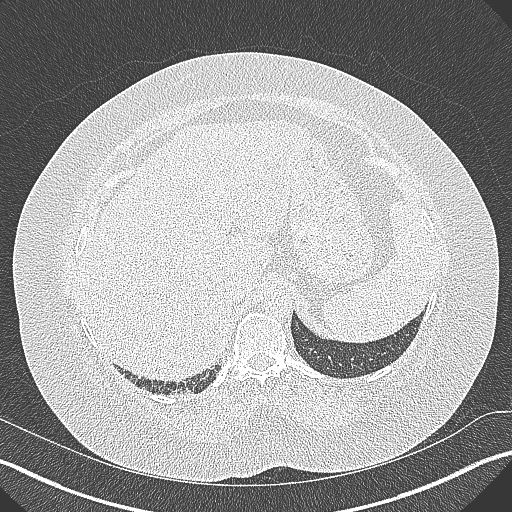
[im 45/269  lung]
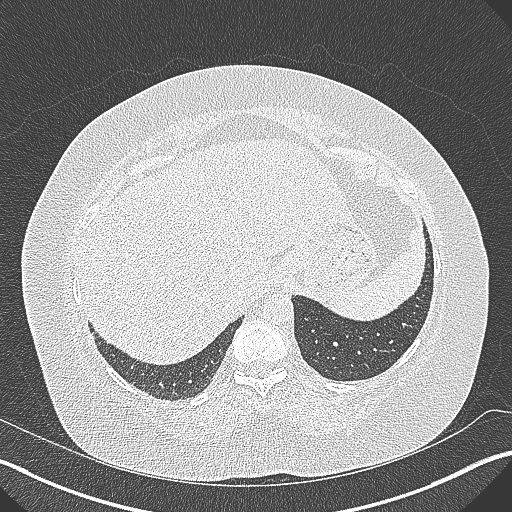
[im 68/269  lung]
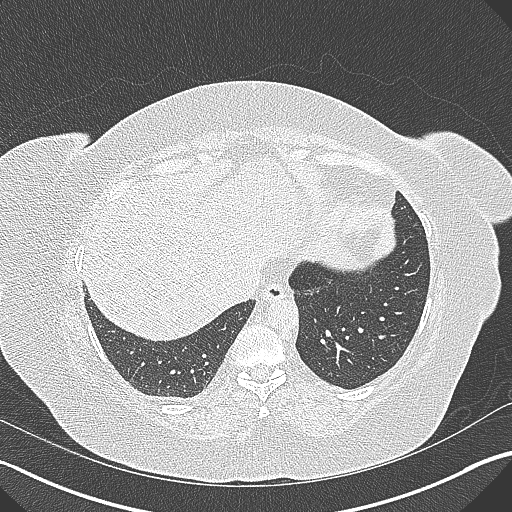
[im 90/269  lung]
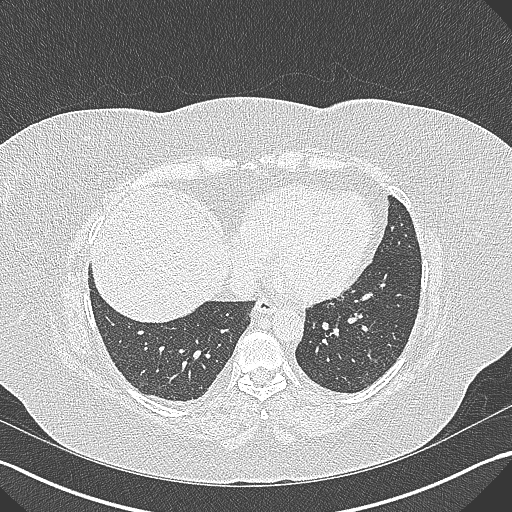
[im 112/269  mediastinal]
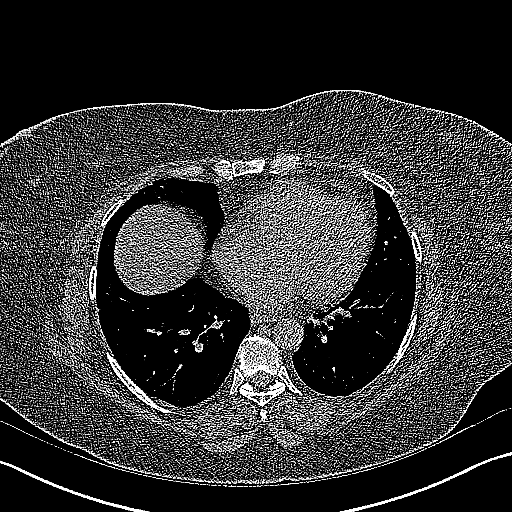
[im 112/269  lung]
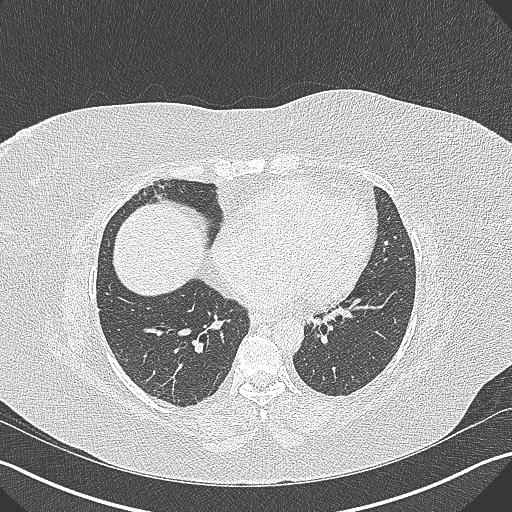
[im 135/269  lung]
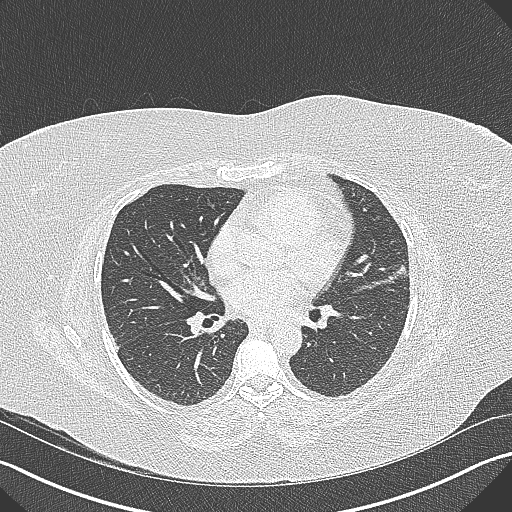
[im 157/269  lung]
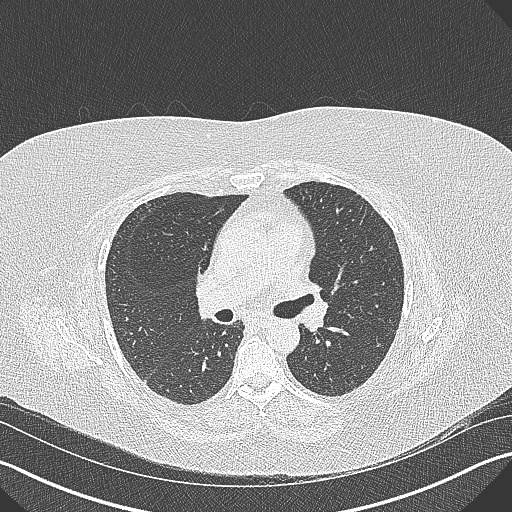
[im 179/269  lung]
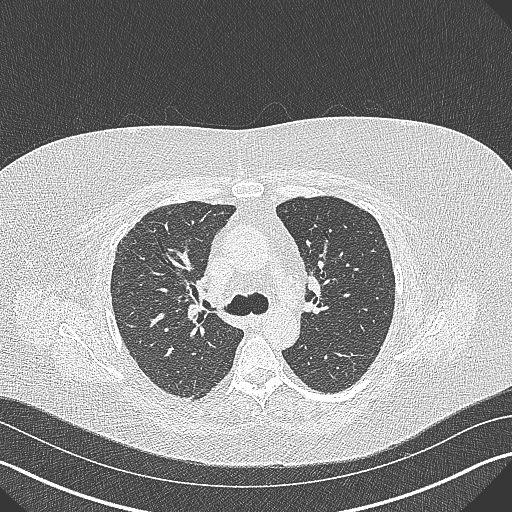
[im 202/269  mediastinal]
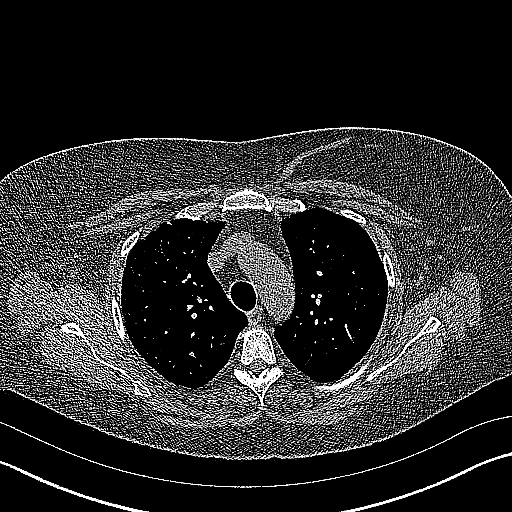
[im 202/269  lung]
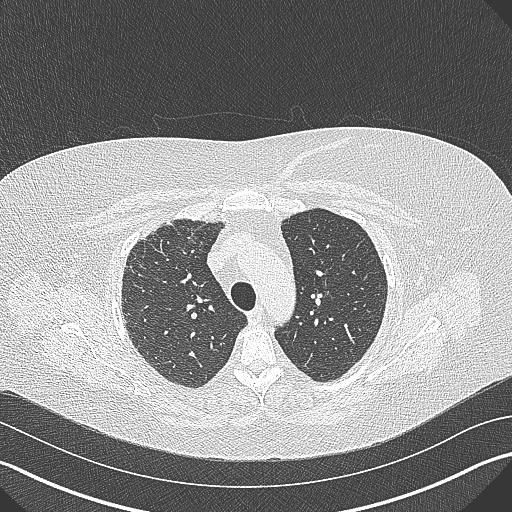
[im 224/269  lung]
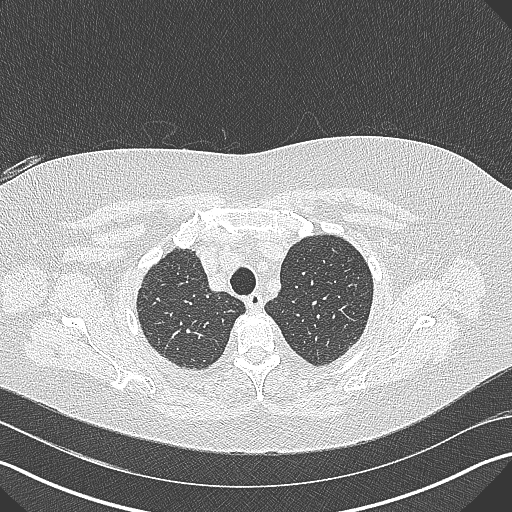
[im 246/269  lung]
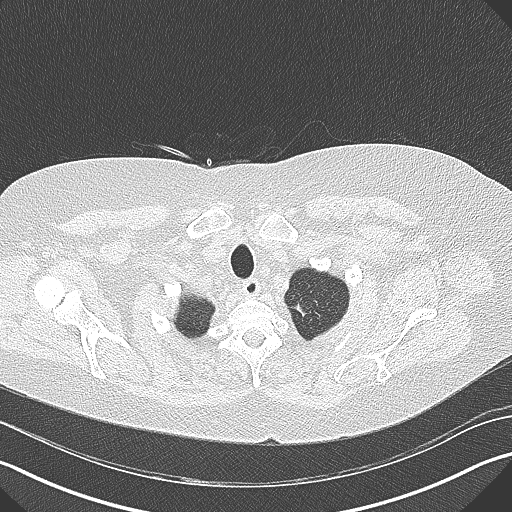

[Series 8: coronal · coronal · 0.55mm/px · 3 of 107 slices shown]
[im 22/107  lung]
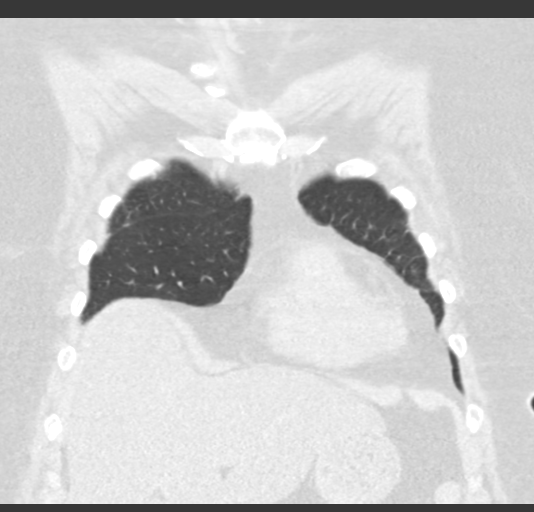
[im 43/107  lung]
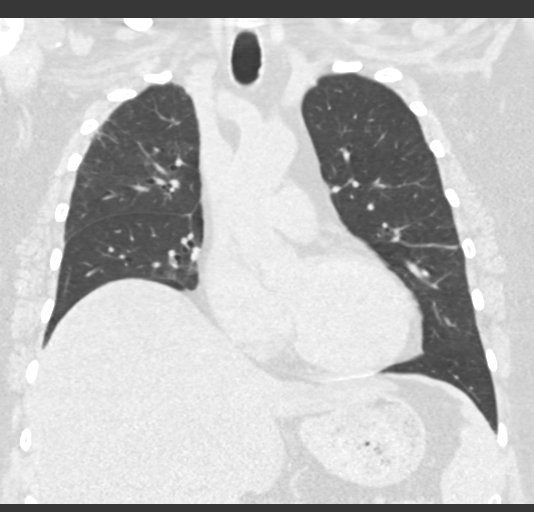
[im 64/107  lung]
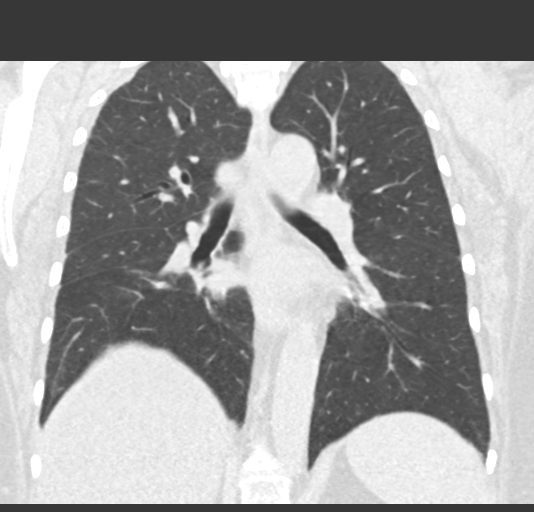

[14 of 36 positions shown; findings below may reference images not displayed]

FINDINGS: Cardiovascular: Normal heart size. No significant pericardial
effusion/thickening. Mild atherosclerotic nonaneurysmal thoracic
aorta. Normal caliber pulmonary arteries.

Mediastinum/Nodes: No discrete thyroid nodules. Unremarkable
esophagus. No pathologically enlarged axillary, mediastinal or hilar
lymph nodes, noting limited sensitivity for the detection of hilar
adenopathy on this noncontrast study.

Lungs/Pleura: No pneumothorax. No pleural effusion. Posterior left
lower lobe 2 mm solid pulmonary nodule (series 5/image 96), obscured
by atelectasis on the prior CT. No acute consolidative airspace
disease, lung masses or additional significant pulmonary nodules.
Residual mild patchy subpleural reticulation and ground-glass
opacity throughout both lungs, asymmetrically prominent in the right
lung. The previously visualized extensive patchy ground-glass
opacities throughout both lungs on the 11/25/2019 chest CT study
have substantially improved. No significant regions of traction
bronchiectasis, architectural distortion or frank honeycombing. No
clear apicobasilar gradient to these findings. Small parenchymal
bands in mid lungs bilaterally. Mild patchy air trapping in both
lungs on the expiration sequence without evidence of
tracheobronchomalacia.

Upper abdomen: Diffuse hepatic steatosis.

Musculoskeletal: No aggressive appearing focal osseous lesions.
Minimal thoracic spondylosis. Partially visualized surgical hardware
from ACDF.
IMPRESSION: 1. Residual mild patchy subpleural reticulation and ground-glass
opacity throughout both lungs, asymmetrically prominent in the right
lung, substantially improved since 11/25/2019 chest CT angiogram
study. No significant traction bronchiectasis or honeycombing. No
clear apicobasilar gradient. Findings may indicate an underlying
interstitial lung disease favoring NSIP versus less likely UIP.
Follow-up high-resolution chest CT study in 12 months may be
obtained to assess temporal pattern stability, as clinically
warranted. Findings are indeterminate for UIP per consensus
guidelines: Diagnosis of Idiopathic Pulmonary Fibrosis: An Official
ATS/ERS/JRS/ALAT Clinical Practice Guideline. Am J Respir Crit Care
Med Vol 198, Hozali 5, ppe22-[id number][DATE].
2. Mild patchy air trapping in both lungs, indicative of small
airways disease.
3. Posterior left lower lobe 2 mm solid pulmonary nodule, obscured
by atelectasis on the prior CT. No follow-up needed if patient is
low-risk. Non-contrast chest CT can be considered in 12 months if
patient is high-risk. This recommendation follows the consensus
statement: Guidelines for Management of Incidental Pulmonary Nodules
Detected on CT Images: From the [HOSPITAL] 7021; Radiology
4. Diffuse hepatic steatosis.
5. Aortic Atherosclerosis ([id number]).

## 2021-08-29 IMAGING — DX DG CHEST 2V
2 series · 2 of 2 positions shown · non-contrast
Comparison: Chest x-ray 02/15/2020.

CLINICAL DATA: 60-year-old female with history of cough and
hypoxemia.

EXAM:
CHEST - 2 VIEW

[chest pa]
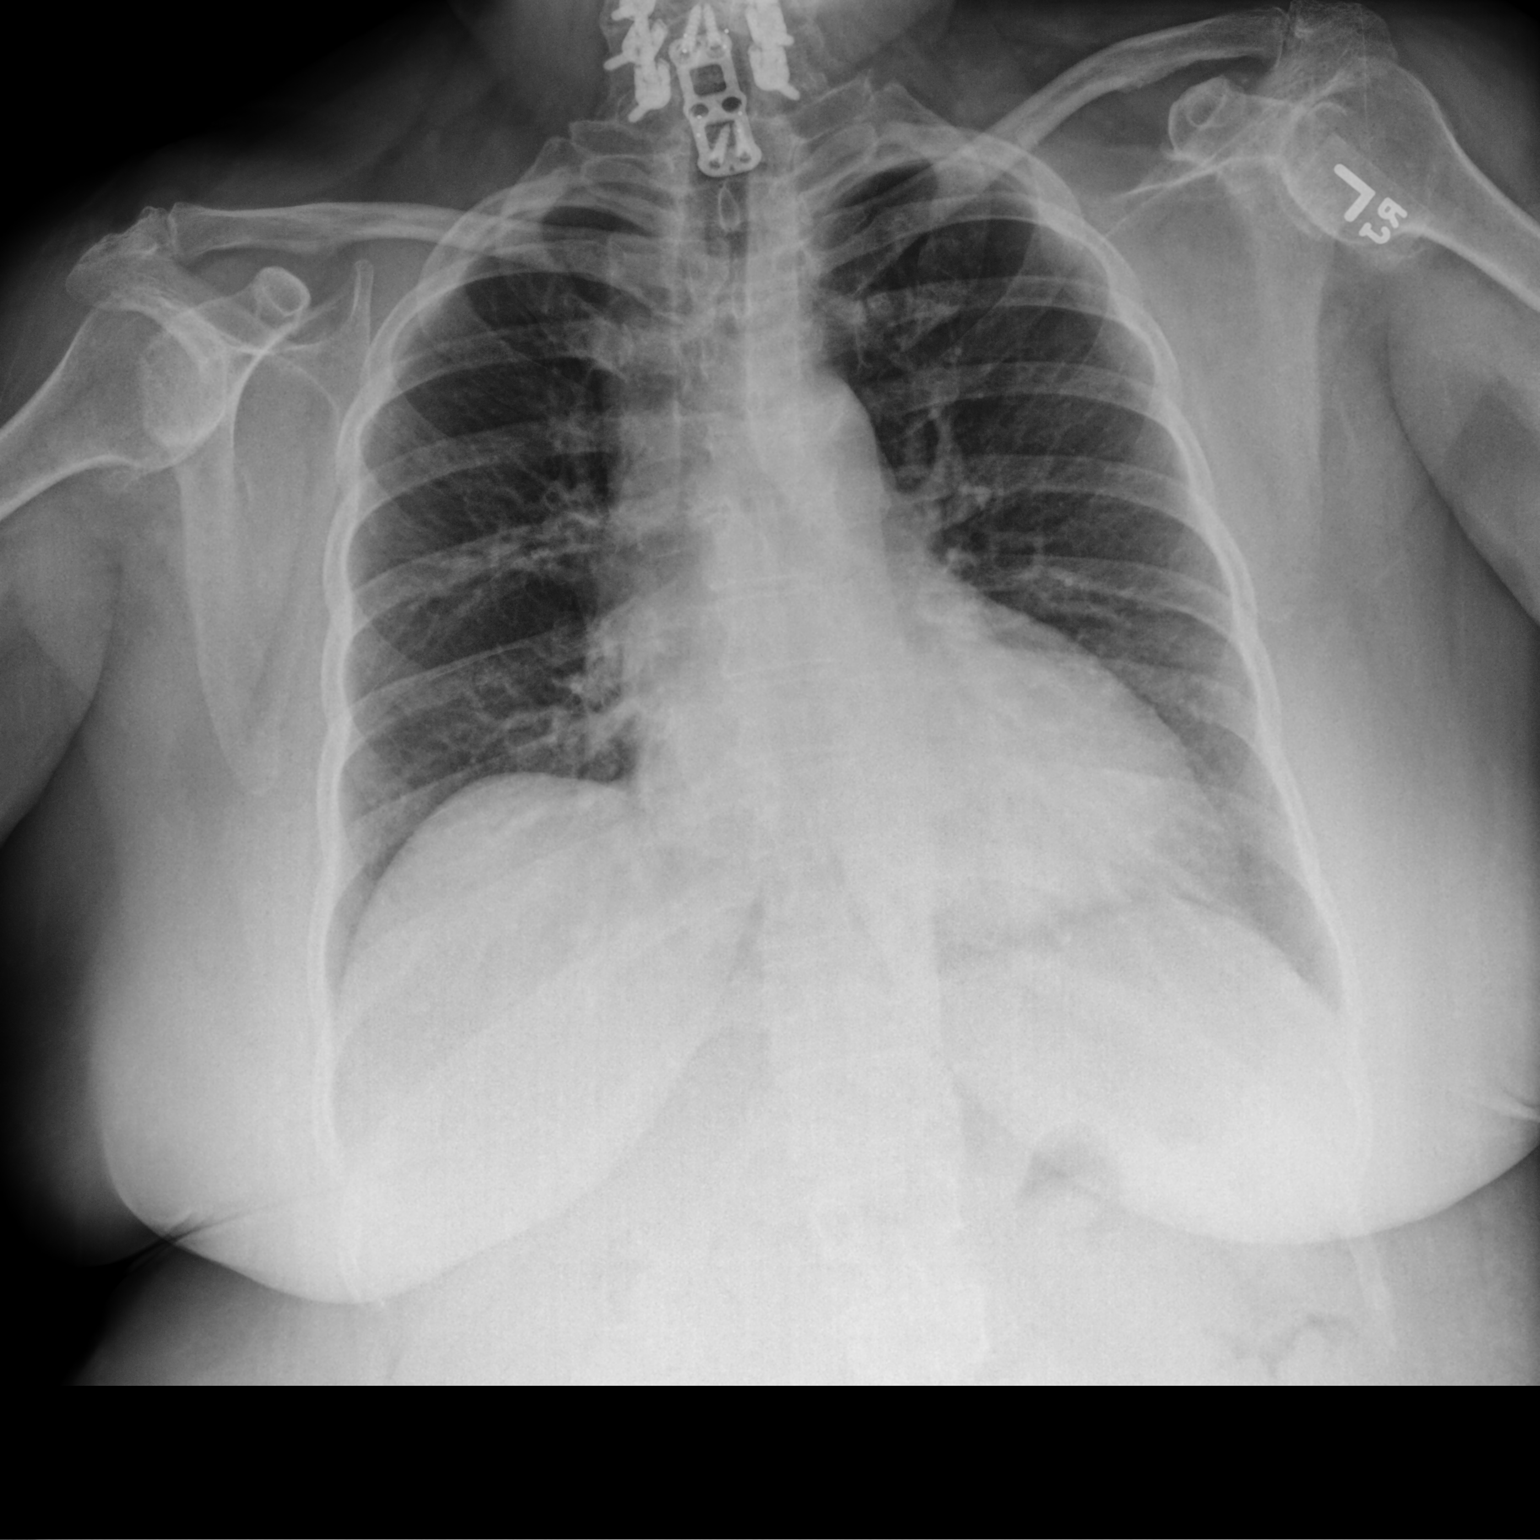

[chest lat]
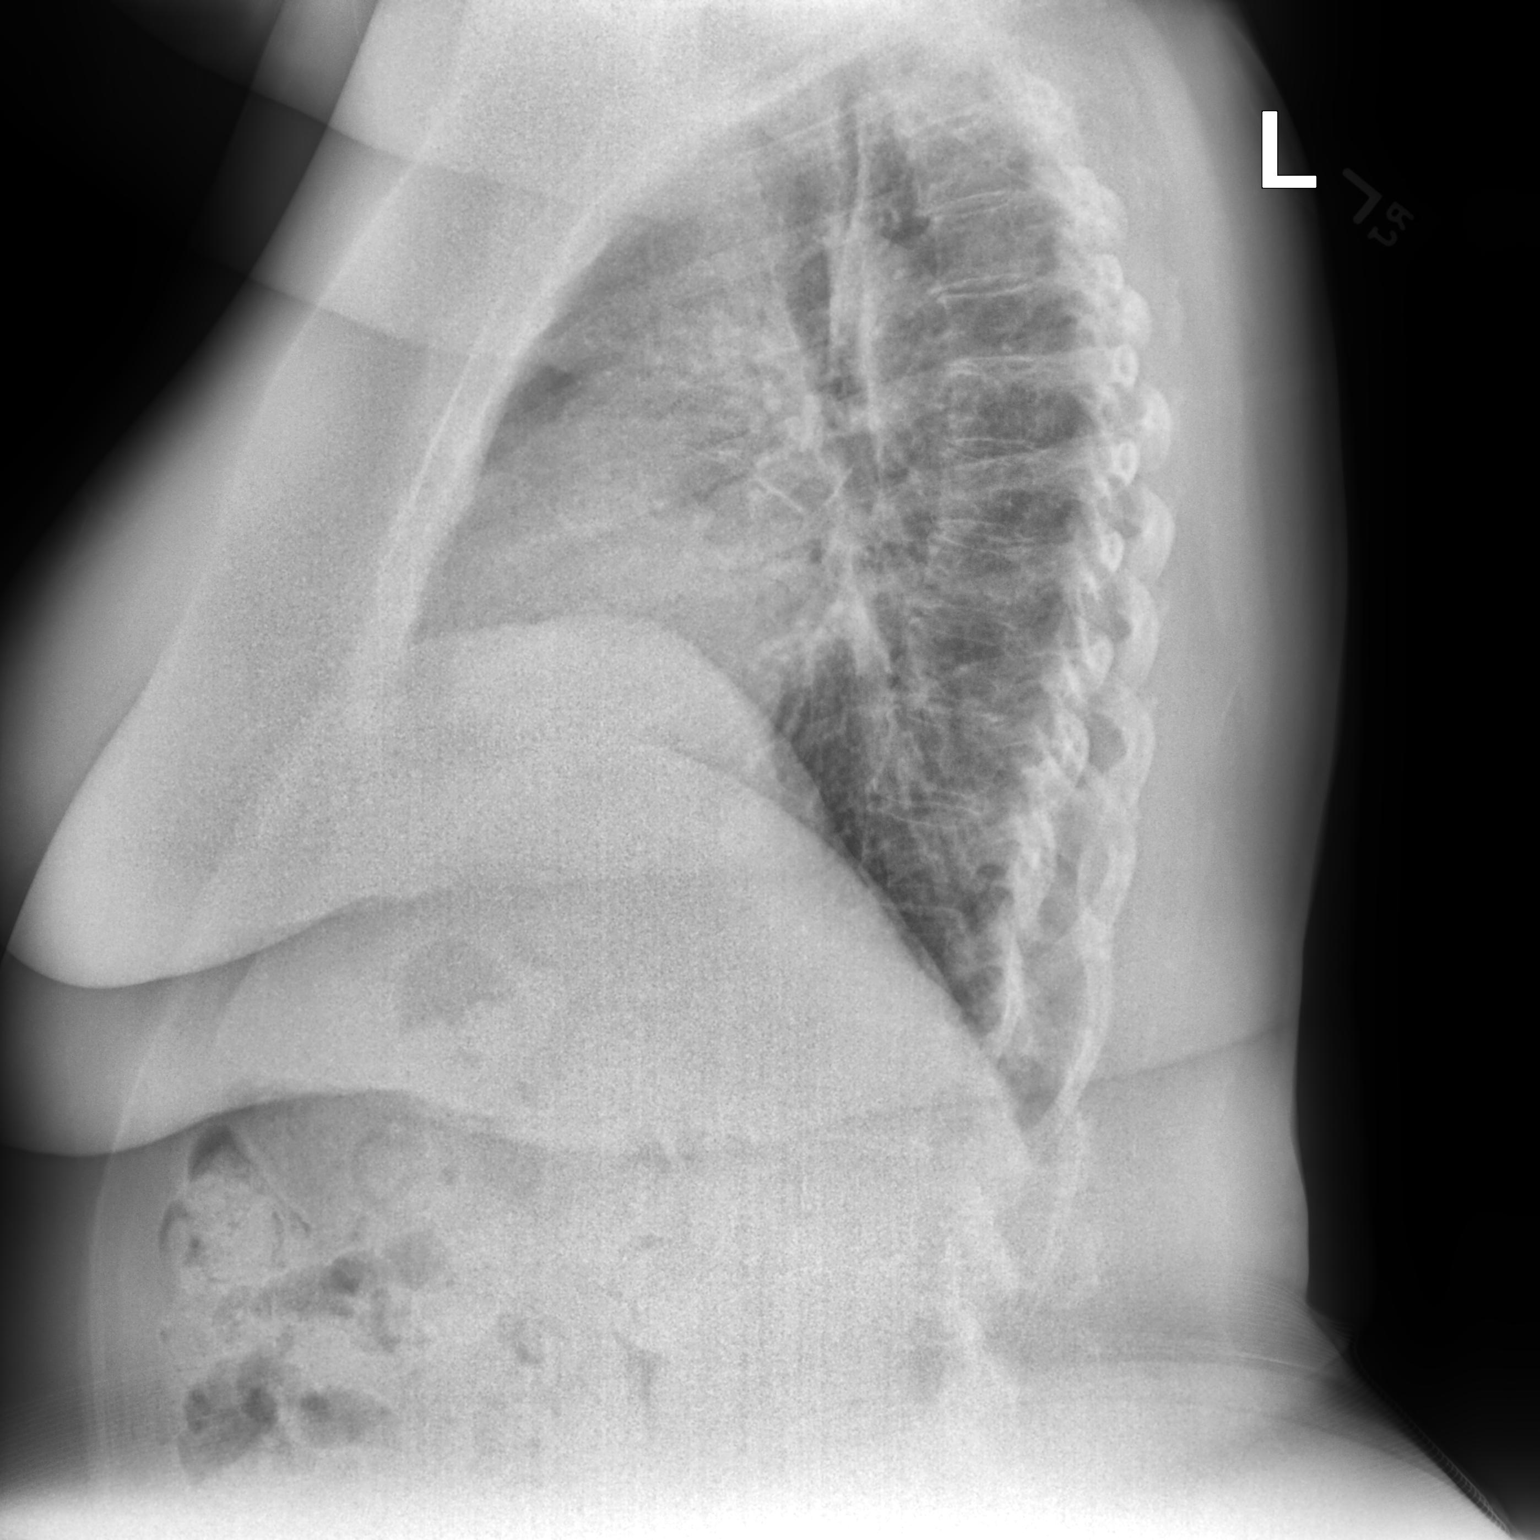

[2 of 2 positions shown; findings below may reference images not displayed]

FINDINGS: Lung volumes are normal. No consolidative airspace disease. No
pleural effusions. No pneumothorax. No pulmonary nodule or mass
noted. Pulmonary vasculature and the cardiomediastinal silhouette
are within normal limits. Orthopedic fixation hardware in the lower
cervical spine incompletely imaged.
IMPRESSION: No radiographic evidence of acute cardiopulmonary disease.
